# Patient Record
Sex: Male | Born: 1965 | Hispanic: Yes | Marital: Single | Smoking: Never smoker
Health system: Southern US, Community
[De-identification: ages and names within clinical notes are randomized; demographics above are authoritative.]

## PROBLEM LIST (undated history)

## (undated) DIAGNOSIS — R7303 Prediabetes: Secondary | ICD-10-CM

## (undated) DIAGNOSIS — G4730 Sleep apnea, unspecified: Secondary | ICD-10-CM

## (undated) DIAGNOSIS — I10 Essential (primary) hypertension: Secondary | ICD-10-CM

---

## 2019-12-26 ENCOUNTER — Encounter (HOSPITAL_COMMUNITY): Payer: Self-pay | Admitting: Emergency Medicine

## 2019-12-26 ENCOUNTER — Other Ambulatory Visit: Payer: Self-pay

## 2019-12-26 ENCOUNTER — Emergency Department (HOSPITAL_COMMUNITY)
Admission: EM | Admit: 2019-12-26 | Discharge: 2019-12-26 | Disposition: A | Discharge status: Home / Self Care | Payer: Medicaid Other | Attending: Emergency Medicine | Admitting: Emergency Medicine

## 2019-12-26 DIAGNOSIS — I10 Essential (primary) hypertension: Secondary | ICD-10-CM | POA: Diagnosis not present

## 2019-12-26 DIAGNOSIS — R040 Epistaxis: Secondary | ICD-10-CM | POA: Diagnosis present

## 2019-12-26 HISTORY — DX: Essential (primary) hypertension: I10

## 2019-12-26 HISTORY — DX: Sleep apnea, unspecified: G47.30

## 2019-12-26 NOTE — ED Notes (Signed)
ED Provider at bedside. 

## 2019-12-26 NOTE — ED Triage Notes (Signed)
Per [facility] pt from work where when he sneezed started having nose bleed.Was given Afrin spray with EMS. Pt is hypertensive and taking his medications.  Pt speak Spanish   using Spanish interpreter Doran Heater [phone].  Pt reports this happened last week and used cold water and it stopped.  Denies any pain.

## 2019-12-26 NOTE — Discharge Instructions (Addendum)
Use the Afrin if you have any future nosebleeds.  You cannot take this for more than 3 days in a row. You can take the Flonase daily to help with congestion. Return to the ED if you have additional nosebleeds that do not improve with pressure or Afrin, lightheadedness, shortness of breath or chest pain.   Utilice Afrin si tiene hemorragias nasales en el futuro. No puede tomar esto durante ms de 3 das seguidos. Puede tomar [employer] para ayudar con la congestin. Regrese al servicio de urgencias si tiene hemorragias nasales adicionales que no mejoran con la presin o con Afrin, mareos, falta de aire o Journalist, newspaper.

## 2019-12-26 NOTE — ED Provider Notes (Signed)
[facility]-EMERGENCY DEPT Provider Note   CSN: [id number] Arrival date & time: 12/26/19  1223     History Chief Complaint  Patient presents with  . Epistaxis    Kurt Wilkinson is a 54 y.o. male with a past medical history of HTN, who presents to ED with a chief complaint of epistaxis. States that last week he had a similar nosebleed last week which resolved with cold water. He was given Afrin by EMS and has been applying pressure and believes this has improved his nosebleed. He has been congested this week and sneezed today prior to the nosebleed. Denies anticoagulant use, lightheadedness, loss of consciousness, headache, vision changes.  HPI     Past Medical History:  Diagnosis Date  . Hypertension   . Sleep apnea     There are no problems to display for this patient.   History reviewed. No pertinent surgical history.     No family history on file.  Social History   Tobacco Use  . Smoking status: Never Smoker  . Smokeless tobacco: Never Used  Substance Use Topics  . Alcohol use: Yes    Comment: socially   . Drug use: Not on file    Home Medications Prior to Admission medications   Not on File    Allergies    Patient has no known allergies.  Review of Systems   Review of Systems  Constitutional: Negative for appetite change, chills and fever.  HENT: Positive for congestion and nosebleeds. Negative for ear pain, rhinorrhea, sneezing and sore throat.   Eyes: Negative for photophobia and visual disturbance.  Respiratory: Negative for cough, chest tightness, shortness of breath and wheezing.   Cardiovascular: Negative for chest pain and palpitations.  Gastrointestinal: Negative for abdominal pain, blood in stool, constipation, diarrhea, nausea and vomiting.  Genitourinary: Negative for dysuria, hematuria and urgency.  Musculoskeletal: Negative for myalgias.  Skin: Negative for rash.  Neurological: Negative for dizziness, weakness and  light-headedness.    Physical Exam Updated Vital Signs BP (!) 154/94 (BP Location: Right Arm)   Pulse (!) 103   Temp 98.2 F (36.8 C) (Oral)   Resp 18   Ht 5\' 9"  (1.753 m)   Wt 89.8 kg   SpO2 97%   BMI 29.24 kg/m   Physical Exam Vitals and nursing note reviewed.  Constitutional:      General: He is not in acute distress.    Appearance: He is well-developed.  HENT:     Head: Normocephalic and atraumatic.     Nose: Nose normal.     Comments: There is an area on the nasal septum of the R nare with dried blood. No active bleeding. Eyes:     General: No scleral icterus.       Right eye: No discharge.        Left eye: No discharge.     Conjunctiva/sclera: Conjunctivae normal.  Cardiovascular:     Rate and Rhythm: Normal rate and regular rhythm.     Heart sounds: Normal heart sounds. No murmur. No friction rub. No gallop.   Pulmonary:     Effort: Pulmonary effort is normal. No respiratory distress.     Breath sounds: Normal breath sounds.  Abdominal:     General: Bowel sounds are normal. There is no distension.     Palpations: Abdomen is soft.     Tenderness: There is no abdominal tenderness. There is no guarding.  Musculoskeletal:        General: Normal  range of motion.     Cervical back: Normal range of motion and neck supple.  Skin:    General: Skin is warm and dry.     Findings: No rash.  Neurological:     Mental Status: He is alert.     Motor: No abnormal muscle tone.     Coordination: Coordination normal.     ED Results / Procedures / Treatments   Labs (all labs ordered are listed, but only abnormal results are displayed) Labs Reviewed - No data to display  EKG None  Radiology No results found.  Procedures Procedures (including critical care time)  Medications Ordered in ED Medications - No data to display  ED Course  I have reviewed the triage vital signs and the nursing notes.  Pertinent labs & imaging results that were available during my  care of the patient were reviewed by me and considered in my medical decision making (see chart for details).    MDM Rules/Calculators/A&P                      54 year old male presents to ED for right-sided epistaxis that began prior to arrival after he sneezed.  Reports congestion for the past several days.  He had similar epistaxis last week which improved with cold water.  On exam there is some oozing noted in the right nare which I believe could be due to his ongoing to having with his gauze.  No bleeding noted in posterior oropharynx.  He denies any lightheadedness, shortness of breath.  Patient told to apply pressure here after given Afrin by EMS.  Patient was observed for about an hour without any recurrence of nosebleeds.  He is comfortable with discharge home.  Educated on use of Afrin as well as Flonase to help with congestion.  Patient is hemodynamically stable, in NAD, and able to ambulate in the ED. Evaluation does not show pathology that would require ongoing emergent intervention or inpatient treatment. I have personally reviewed and interpreted all lab work and imaging at today's ED visit. I explained the diagnosis to the patient. Pain has been managed and has no complaints prior to discharge. Patient is comfortable with above plan and is stable for discharge at this time. All questions were answered prior to disposition. Strict return precautions for returning to the ED were discussed. Encouraged follow up with PCP.   An After Visit Summary was printed and given to the patient.   Portions of this note were generated with Lobbyist. Dictation errors may occur despite best attempts at proofreading.  Final Clinical Impression(s) / ED Diagnoses Final diagnoses:  Right-sided epistaxis    Rx / DC Orders ED Discharge Orders    None       Delia Heady, PA-C 12/26/19 1334    Hayden Rasmussen, MD 12/26/19 1816

## 2020-01-07 ENCOUNTER — Emergency Department (HOSPITAL_COMMUNITY)
Admission: EM | Admit: 2020-01-07 | Discharge: 2020-01-07 | Disposition: A | Discharge status: Home / Self Care | Payer: Medicaid Other | Attending: Emergency Medicine | Admitting: Emergency Medicine

## 2020-01-07 ENCOUNTER — Other Ambulatory Visit: Payer: Self-pay

## 2020-01-07 DIAGNOSIS — I10 Essential (primary) hypertension: Secondary | ICD-10-CM | POA: Diagnosis not present

## 2020-01-07 DIAGNOSIS — R040 Epistaxis: Secondary | ICD-10-CM | POA: Diagnosis not present

## 2020-01-07 DIAGNOSIS — Z79899 Other long term (current) drug therapy: Secondary | ICD-10-CM | POA: Diagnosis not present

## 2020-01-07 NOTE — ED Provider Notes (Signed)
[facility] EMERGENCY DEPARTMENT Provider Note   CSN: [id number] Arrival date & time: 01/07/20  1529     History Chief Complaint  Patient presents with  . Epistaxis    Kurt Wilkinson is a 54 y.o. male.  HPI   Patient presents to the ED for evaluation of epistaxis.  Patient states he had an episode of a nosebleed on March 5.  Patient ended up coming to the ED after that episode.  Patient did not have any active bleeding.  He was instructed on outpatient management.  Patient states he had another episode today.  He had one episode in the morning.  He had another episode this afternoon so he came to the ED.  Patient's bleeding has now stopped.  Patient states it started after he sneezed.  He did try to apply pressure but as he demonstrates this to me he shows himself pinching the bridge of his nose.  Past Medical History:  Diagnosis Date  . Hypertension   . Sleep apnea     There are no problems to display for this patient.   No past surgical history on file.     No family history on file.  Social History   Tobacco Use  . Smoking status: Never Smoker  . Smokeless tobacco: Never Used  Substance Use Topics  . Alcohol use: Yes    Comment: socially   . Drug use: Not on file    Home Medications Prior to Admission medications   Medication Sig Start Date End Date Taking? Authorizing Provider  amLODipine (NORVASC) 10 MG tablet Take 10 mg by mouth daily.   Yes [provider]  lisinopril (ZESTRIL) 40 MG tablet Take 40 mg by mouth in the morning and at bedtime.   Yes [provider]  oxymetazoline (AFRIN) 0.05 % nasal spray Place 1 spray into both nostrils 2 (two) times daily.   Yes [provider]    Allergies    Patient has no known allergies.  Review of Systems   Review of Systems  All other systems reviewed and are negative.   Physical Exam Updated Vital Signs BP (!) 152/81   Pulse (!) 106   Temp 98.4 F (36.9 C)  (Oral)   Resp 16   Ht 1.753 m (5\' 9" )   Wt 89.8 kg   SpO2 98%   BMI 29.24 kg/m   Physical Exam Vitals and nursing note reviewed.  Constitutional:      General: He is not in acute distress.    Appearance: He is well-developed.  HENT:     Head: Normocephalic and atraumatic.     Right Ear: External ear normal.     Left Ear: External ear normal.     Nose:     Comments: Dried blood right anterior nares, no active bleeding, some hyperemia noted around the scab in the nasal septum on the right side Eyes:     General: No scleral icterus.       Right eye: No discharge.        Left eye: No discharge.     Conjunctiva/sclera: Conjunctivae normal.  Neck:     Trachea: No tracheal deviation.  Cardiovascular:     Rate and Rhythm: Normal rate.  Pulmonary:     Effort: Pulmonary effort is normal. No respiratory distress.     Breath sounds: No stridor.  Abdominal:     General: There is no distension.  Musculoskeletal:        General:  No swelling or deformity.     Cervical back: Neck supple.  Skin:    General: Skin is warm and dry.     Findings: No rash.  Neurological:     Mental Status: He is alert.     Cranial Nerves: Cranial nerve deficit: no gross deficits.     ED Results / Procedures / Treatments   Labs (all labs ordered are listed, but only abnormal results are displayed) Labs Reviewed - No data to display  EKG None  Radiology No results found.  Procedures Procedures (including critical care time)  Medications Ordered in ED Medications - No data to display  ED Course  I have reviewed the triage vital signs and the nursing notes.  Pertinent labs & imaging results that were available during my care of the patient were reviewed by me and considered in my medical decision making (see chart for details).    MDM Rules/Calculators/A&P                      Monitored in the ED.  No recurrent bleeding.  Discussed how to apply pressure.  Recommend saline spray, can use  afrin if bleeding recurs.  Follow up with ENT Final Clinical Impression(s) / ED Diagnoses Final diagnoses:  Epistaxis    Rx / DC Orders ED Discharge Orders    None       Dorie Rank, MD 01/07/20 1807

## 2020-01-07 NOTE — Discharge Instructions (Signed)
Use over the counter saline nasal spray to keep your nose moist, avoid scratching your nose.  You can use the afrin spray and pinch the bottom of your nose for 15 minutes to help control the bleeding if it restarts

## 2020-01-07 NOTE — ED Triage Notes (Signed)
Pt here from home for evaluation of two nosebleeds today, the first resolved, but the second was still ongoing at time of EMS call. Administered afrin PTA. No blood thinners. No bleeding at the time of triage.

## 2020-01-07 NOTE — ED Notes (Signed)
Translator used to go over discharge papers.  

## 2020-02-17 ENCOUNTER — Encounter (HOSPITAL_BASED_OUTPATIENT_CLINIC_OR_DEPARTMENT_OTHER): Payer: Self-pay | Admitting: *Deleted

## 2020-02-17 ENCOUNTER — Other Ambulatory Visit: Payer: Self-pay

## 2020-02-17 ENCOUNTER — Emergency Department (HOSPITAL_BASED_OUTPATIENT_CLINIC_OR_DEPARTMENT_OTHER)
Admission: EM | Admit: 2020-02-17 | Discharge: 2020-02-18 | Disposition: A | Discharge status: Home / Self Care | Payer: Medicaid Other | Attending: Emergency Medicine | Admitting: Emergency Medicine

## 2020-02-17 DIAGNOSIS — R0989 Other specified symptoms and signs involving the circulatory and respiratory systems: Secondary | ICD-10-CM | POA: Diagnosis not present

## 2020-02-17 DIAGNOSIS — T61781A Other shellfish poisoning, accidental (unintentional), initial encounter: Secondary | ICD-10-CM

## 2020-02-17 DIAGNOSIS — I10 Essential (primary) hypertension: Secondary | ICD-10-CM | POA: Diagnosis not present

## 2020-02-17 DIAGNOSIS — Z79899 Other long term (current) drug therapy: Secondary | ICD-10-CM | POA: Insufficient documentation

## 2020-02-17 DIAGNOSIS — T781XXA Other adverse food reactions, not elsewhere classified, initial encounter: Secondary | ICD-10-CM

## 2020-02-17 MED ORDER — FAMOTIDINE IN NACL 20-0.9 MG/50ML-% IV SOLN
INTRAVENOUS | Status: AC
  Administered 2020-02-17: 40 mg via INTRAVENOUS
  Filled 2020-02-17: qty 50

## 2020-02-17 MED ORDER — DIPHENHYDRAMINE HCL 50 MG/ML IJ SOLN
INTRAMUSCULAR | Status: AC
  Administered 2020-02-17: 50 mg
  Filled 2020-02-17: qty 1

## 2020-02-17 MED ORDER — METHYLPREDNISOLONE SODIUM SUCC 125 MG IJ SOLR
INTRAMUSCULAR | Status: AC
  Administered 2020-02-17: 125 mg
  Filled 2020-02-17: qty 2

## 2020-02-17 NOTE — ED Provider Notes (Signed)
[facility] Provider Note: [name], MD, FACEP  CSN: [id number] MRN: [id number] ARRIVAL: 02/17/20 at 2320 ROOM: [facility]   CHIEF COMPLAINT  Allergic Reaction   HISTORY OF PRESENT ILLNESS  02/17/20 11:38 PM Kurt Wilkinson is a 54 y.o. male with a history of shellfish allergy.  He ate soup about 30 minutes prior to arrival which may have contained crab.  He subsequently developed throat swelling with difficulty swallowing as well as generalized itching and 2 episodes of diarrhea.  He denies nausea or vomiting.  He is not wheezing.  Symptoms are moderate.  He was being given IV Benadryl and Pepcid as I entered the room.    Past Medical History:  Diagnosis Date  . Hypertension   . Sleep apnea     History reviewed. No pertinent surgical history.  No family history on file.  Social History   Tobacco Use  . Smoking status: Never Smoker  . Smokeless tobacco: Never Used  Substance Use Topics  . Alcohol use: Yes    Comment: socially   . Drug use: Not on file    Prior to Admission medications   Medication Sig Start Date End Date Taking? Authorizing Provider  amLODipine (NORVASC) 10 MG tablet Take 10 mg by mouth daily.    [provider]  lisinopril (ZESTRIL) 40 MG tablet Take 40 mg by mouth in the morning and at bedtime.    [provider]  oxymetazoline (AFRIN) 0.05 % nasal spray Place 1 spray into both nostrils 2 (two) times daily.    [provider]    Allergies Patient has no known allergies.   REVIEW OF SYSTEMS  Negative except as noted here or in the History of Present Illness.   PHYSICAL EXAMINATION  Initial Vital Signs Blood pressure (!) 155/94, pulse 92, temperature 98.7 F (37.1 C), temperature source Oral, resp. rate 18, height 5\' 9"  (1.753 m), weight 85.7 kg, SpO2 98 %.  Examination General: Well-developed, well-nourished male in no acute distress; appearance consistent with age of record HENT: normocephalic;  atraumatic; mild pharyngeal edema; no stridor Eyes: pupils equal, round and reactive to light; extraocular muscles intact Neck: supple Heart: regular rate and rhythm Lungs: clear to auscultation bilaterally Abdomen: soft; nondistended; nontender; bowel sounds present Extremities: No deformity; full range of motion; pulses normal Neurologic: Awake, alert; motor function intact in all extremities and symmetric; no facial droop Skin: Warm and dry; no visible rash Psychiatric: Normal mood and affect   RESULTS  Summary of this visit's results, reviewed and interpreted by myself:   EKG Interpretation  Date/Time:    Ventricular Rate:    PR Interval:    QRS Duration:   QT Interval:    QTC Calculation:   R Axis:     Text Interpretation:        Laboratory Studies: No results found for this or any previous visit (from the past 24 hour(s)). Imaging Studies: No results found.  ED COURSE and MDM  Nursing notes, initial and subsequent vitals signs, including pulse oximetry, reviewed and interpreted by myself.  Vitals:   02/17/20 2331 02/18/20 0123  BP: (!) 155/94 (!) 148/84  Pulse: 92 90  Resp: 18 18  Temp: 98.7 F (37.1 C) 98.7 F (37.1 C)  TempSrc: Oral Oral  SpO2: 98% 99%  Weight: 85.7 kg   Height: 5\' 9"  (1.753 m)    Medications  methylPREDNISolone sodium succinate (SOLU-MEDROL) 125 mg/2 mL injection (125 mg  Given 02/17/20 2343)  diphenhydrAMINE (BENADRYL)  50 MG/ML injection (50 mg  Given 02/17/20 2342)  famotidine (PEPCID) 20-0.9 MG/50ML-% IVPB (  Stopped 02/18/20 0048)   1:26 AM Patient symptoms resolved.  We will keep him on Benadryl for the next 24 hours and then as needed.  We will also provide an EpiPen.   PROCEDURES  Procedures   ED DIAGNOSES     ICD-10-CM   1. Allergic reaction to shellfish  T78.1XXA        Cary Lothrop, Jenny Reichmann, MD 02/18/20 [phone]

## 2020-02-17 NOTE — ED Triage Notes (Addendum)
Pt c/o allergic reaction x 30 mins ago after eating soup/ pt states hard to swallow , tightness  in throat , rash  . 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

## 2020-02-18 ENCOUNTER — Emergency Department: Payer: Medicaid Other

## 2020-02-18 ENCOUNTER — Encounter (HOSPITAL_BASED_OUTPATIENT_CLINIC_OR_DEPARTMENT_OTHER): Payer: Self-pay | Admitting: *Deleted

## 2020-02-18 ENCOUNTER — Emergency Department (HOSPITAL_BASED_OUTPATIENT_CLINIC_OR_DEPARTMENT_OTHER): Payer: Medicaid Other

## 2020-02-18 ENCOUNTER — Emergency Department (HOSPITAL_BASED_OUTPATIENT_CLINIC_OR_DEPARTMENT_OTHER)
Admission: EM | Admit: 2020-02-18 | Discharge: 2020-02-18 | Disposition: A | Discharge status: Home / Self Care | Payer: Medicaid Other | Source: Home / Self Care | Attending: Emergency Medicine | Admitting: Emergency Medicine

## 2020-02-18 ENCOUNTER — Other Ambulatory Visit: Payer: Self-pay

## 2020-02-18 DIAGNOSIS — E041 Nontoxic single thyroid nodule: Secondary | ICD-10-CM

## 2020-02-18 DIAGNOSIS — E876 Hypokalemia: Secondary | ICD-10-CM

## 2020-02-18 LAB — BASIC METABOLIC PANEL
Anion gap: 14 (ref 5–15)
BUN: 19 mg/dL (ref 6–20)
CO2: 23 mmol/L (ref 22–32)
Calcium: 9 mg/dL (ref 8.9–10.3)
Chloride: 103 mmol/L (ref 98–111)
Creatinine, Ser: 1.07 mg/dL (ref 0.61–1.24)
GFR calc Af Amer: >60 mL/min (ref >60)
GFR calc non Af Amer: >60 mL/min (ref >60)
Glucose, Bld: 245 mg/dL — ABNORMAL HIGH (ref 70–99)
Potassium: 2.7 mmol/L — CL (ref 3.5–5.1)
Sodium: 140 mmol/L (ref 135–145)

## 2020-02-18 LAB — MAGNESIUM: Magnesium: 2.3 mg/dL (ref 1.7–2.4)

## 2020-02-18 LAB — TSH: TSH: 0.499 u[IU]/mL (ref 0.350–4.500)

## 2020-02-18 LAB — CBC
HCT: 43.9 % (ref 39.0–52.0)
Hemoglobin: 15.3 g/dL (ref 13.0–17.0)
MCH: 32.8 pg (ref 26.0–34.0)
MCHC: 34.9 g/dL (ref 30.0–36.0)
MCV: 94.2 fL (ref 80.0–100.0)
Platelets: 181 10*3/uL (ref 150–400)
RBC: 4.66 MIL/uL (ref 4.22–5.81)
RDW: 14.7 % (ref 11.5–15.5)
WBC: 6.7 10*3/uL (ref 4.0–10.5)
nRBC: 0 % (ref 0.0–0.2)

## 2020-02-18 LAB — T4, FREE: Free T4: 0.67 ng/dL (ref 0.61–1.12)

## 2020-02-18 LAB — TROPONIN I (HIGH SENSITIVITY)
Troponin I (High Sensitivity): 10 ng/L (ref <18)
Troponin I (High Sensitivity): 22 ng/L — ABNORMAL HIGH (ref <18)
Troponin I (High Sensitivity): 24 ng/L — ABNORMAL HIGH (ref <18)

## 2020-02-18 LAB — CBG MONITORING, ED: Glucose-Capillary: 188 mg/dL — ABNORMAL HIGH (ref 70–99)

## 2020-02-18 MED ORDER — POTASSIUM CHLORIDE CRYS ER 20 MEQ PO TBCR
40.0000 meq | EXTENDED_RELEASE_TABLET | Freq: Once | ORAL | Status: AC
  Administered 2020-02-18: 10:00:00 40 meq via ORAL
  Filled 2020-02-18: qty 2

## 2020-02-18 MED ORDER — EPINEPHRINE 0.3 MG/0.3ML IJ SOAJ: INTRAMUSCULAR | 0 refills | Status: AC

## 2020-02-18 MED ORDER — POTASSIUM CHLORIDE ER 10 MEQ PO TBCR
10.0000 meq | EXTENDED_RELEASE_TABLET | Freq: Two times a day (BID) | ORAL | 0 refills | Status: DC
Start: 2020-02-18 — End: 2020-07-25

## 2020-02-18 MED ORDER — SODIUM CHLORIDE 0.9 % IV SOLN: INTRAVENOUS | Status: DC

## 2020-02-18 MED ORDER — DIPHENHYDRAMINE HCL 25 MG PO TABS: 50.0000 mg | ORAL_TABLET | Freq: Four times a day (QID) | ORAL | 0 refills | Status: AC | PRN

## 2020-02-18 MED ORDER — ASPIRIN 81 MG PO CHEW
324.0000 mg | CHEWABLE_TABLET | Freq: Once | ORAL | Status: AC
  Administered 2020-02-18: 324 mg via ORAL
  Filled 2020-02-18: qty 4

## 2020-02-18 MED ORDER — POTASSIUM CHLORIDE 10 MEQ/100ML IV SOLN
10.0000 meq | INTRAVENOUS | Status: AC
  Administered 2020-02-18 (×2): 10 meq via INTRAVENOUS
  Filled 2020-02-18 (×2): qty 100

## 2020-02-18 MED ORDER — IOHEXOL 350 MG/ML SOLN
100.0000 mL | Freq: Once | INTRAVENOUS | Status: AC | PRN
  Administered 2020-02-18: 100 mL via INTRAVENOUS

## 2020-02-18 MED ORDER — AMLODIPINE BESYLATE 10 MG PO TABS: 10.0000 mg | ORAL_TABLET | Freq: Every day | ORAL | 1 refills | Status: AC

## 2020-02-18 MED ORDER — SODIUM CHLORIDE 0.9 % IV BOLUS
1000.0000 mL | Freq: Once | INTRAVENOUS | Status: AC
  Administered 2020-02-18: 1000 mL via INTRAVENOUS

## 2020-02-18 NOTE — Discharge Instructions (Signed)
It is important for you to follow up with a primary care doctor or endocrinologist doctor as we discussed to evaluate the thyroid nodule noted on CT scan today.  Often times these are benign but we need to make sure this is not a type of cancer.  You should also have your potassium level rechecked in the next week.  Es importante que realice un seguimiento con un mdico de atencin primaria o un mdico endocrinlogo, como lo discutimos, para Heritage manager ndulo tiroideo que se observa en la tomografa computarizada hoy. A menudo, estos son benignos, pero debemos asegurarnos de que no sea un tipo de Database administrator.  Tambin debe volver a controlar su nivel de potasio en la prxima semana.

## 2020-02-18 NOTE — ED Provider Notes (Addendum)
[facility] EMERGENCY DEPARTMENT Provider Note   CSN: [id number] Arrival date & time: 02/18/20  0849     History Chief Complaint  Patient presents with  . Heart Pounding    Kurt Wilkinson is a 54 y.o. male.  HPI  HPI: A 54 year old patient with a history of hypertension presents for evaluation of chest pain. Initial onset of pain was approximately 1-3 hours ago. The patient's chest pain is not worse with exertion. The patient's chest pain is not middle- or left-sided, is not well-localized, is not described as heaviness/pressure/tightness, is not sharp and does radiate to the arms/jaw/neck. The patient does not complain of nausea and denies diaphoresis. The patient has no history of stroke, has no history of peripheral artery disease, has not smoked in the past 90 days, denies any history of treated diabetes, has no relevant family history of coronary artery disease (first degree relative at less than age 68), has no history of hypercholesterolemia and does not have an elevated BMI (>=30). Pt states he has history of htn.  He took his meds this am.  He then started having cramping in his arms and heart beating fast as well as cramping  In his stomach.  Pt denies any chest pain.  Just cramping in the arms.  No trouble with shortness of breath.  The sx have resolved at this point.  They last about 1/2 an hour.  Past Medical History:  Diagnosis Date  . Hypertension   . Sleep apnea     There are no problems to display for this patient.   History reviewed. No pertinent surgical history.     History reviewed. No pertinent family history.  Social History   Tobacco Use  . Smoking status: Never Smoker  . Smokeless tobacco: Never Used  Substance Use Topics  . Alcohol use: Yes    Comment: socially   . Drug use: Never    Home Medications Prior to Admission medications   Medication Sig Start Date End Date Taking? Authorizing Provider  amLODipine (NORVASC) 10 MG tablet  Take 10 mg by mouth daily.   Yes [provider]  lisinopril (ZESTRIL) 40 MG tablet Take 40 mg by mouth in the morning and at bedtime.   Yes [provider]  diphenhydrAMINE (BENADRYL) 25 MG tablet Take 2 tablets (50 mg total) by mouth every 6 (six) hours as needed for itching or allergies. 02/18/20   Molpus, John, MD  EPINEPHrine (EPIPEN 2-PAK) 0.3 mg/0.3 mL IJ SOAJ injection Self administer per package instructions as needed for severe allergic reaction and seek medical care. 02/18/20   Molpus, John, MD  oxymetazoline (AFRIN) 0.05 % nasal spray Place 1 spray into both nostrils 2 (two) times daily.    [provider]    Allergies    Parke Simmers allergy]  Review of Systems   Review of Systems  Physical Exam Updated Vital Signs BP (!) 145/94   Pulse 100   Resp (!) 25   Ht 1.753 m (5\' 9" )   Wt 85.7 kg   SpO2 96%   BMI 27.91 kg/m   Physical Exam  ED Results / Procedures / Treatments   Labs (all labs ordered are listed, but only abnormal results are displayed) Labs Reviewed  BASIC METABOLIC PANEL - Abnormal; Notable for the following components:      Result Value   Potassium 2.7 (*)    Glucose, Bld 245 (*)    All other components within normal limits  CBG MONITORING,  ED - Abnormal; Notable for the following components:   Glucose-Capillary 188 (*)    All other components within normal limits  TROPONIN I (HIGH SENSITIVITY) - Abnormal; Notable for the following components:   Troponin I (High Sensitivity) 22 (*)    All other components within normal limits  CBC  T4, FREE  TSH  MAGNESIUM  TROPONIN I (HIGH SENSITIVITY)    EKG EKG Interpretation  Date/Time:  Wednesday February 18 2020 08:55:14 EDT Ventricular Rate:  113 PR Interval:    QRS Duration: 88 QT Interval:  326 QTC Calculation: 447 R Axis:   56 Text Interpretation: Sinus tachycardia Probable left atrial enlargement Repol abnrm, diffusely no old tracing for comparison Confirmed by  Linwood Dibbles [phone]) on 02/18/2020 9:03:14 AM   Radiology DG Chest Portable 1 View  Result Date: 02/18/2020 CLINICAL DATA:  Chest pain. EXAM: PORTABLE CHEST 1 VIEW COMPARISON:  No pertinent prior studies available for comparison. FINDINGS: Heart size within normal limits. No appreciable airspace consolidation. No evidence of pleural effusion or pneumothorax. No acute bony abnormality. IMPRESSION: No evidence of acute cardiopulmonary abnormality. Electronically Signed   By: Jackey Loge DO   On: 02/18/2020 10:03    Procedures Procedures (including critical care time)  Medications Ordered in ED Medications  sodium chloride 0.9 % bolus 1,000 mL (0 mLs Intravenous Stopped 02/18/20 1043)    And  0.9 %  sodium chloride infusion ( Intravenous New Bag/Given 02/18/20 1042)  aspirin chewable tablet 324 mg (324 mg Oral Given 02/18/20 1015)  potassium chloride SA (KLOR-CON) CR tablet 40 mEq (40 mEq Oral Given 02/18/20 1014)  potassium chloride 10 mEq in 100 mL IVPB (0 mEq Intravenous Stopped 02/18/20 1222)    ED Course  I have reviewed the triage vital signs and the nursing notes.  Pertinent labs & imaging results that were available during my care of the patient were reviewed by me and considered in my medical decision making (see chart for details).  Clinical Course as of Feb 19 828  Wed Feb 18, 2020  1007 Notify the patient's potassium is  2.7   [JK]  1253 Magnesium levels normal.  Troponin is normal.   [JK]  1253 Chest x-ray without acute findings  DG Chest Portable 1 View [JK]  1557 CT notable for thyroid nodule.  TSH is normal.  No hyperthyroidism.    [JK]  1557 2nd trop with increased delta.  Sx not suggestive of acs.  Will check 3rd trop.  If stable I think pt can follow up as outpatient   [JK]  1558 Care turned over to Dr Deretha Emory   [JK]    Clinical Course User Index [JK] Linwood Dibbles, MD   MDM Rules/Calculators/A&P HEAR Score: 3                    Patient presents to the ED for  evaluation of palpitations.  Patient denies having any specific chest pain.  Was tachycardic on arrival.  Patient's labs were notable for hypokalemia.  He has been given IV potassium.  Patient has had some persistent tachycardia and slight elevation in troponin.  He does have some nonspecific EKG changes.   This may be related to his hypokalemia but he did have a rise in his troponin.  With his tachycardia and slight elevation of troponin I will proceed with CT angiogram to rule out pulmonary embolism. Final Clinical Impression(s) / ED Diagnoses Final diagnoses:  None    Rx / DC Orders ED  Discharge Orders    None       Dorie Rank, MD 02/18/20 1436    Dorie Rank, MD 02/19/20 0830

## 2020-02-18 NOTE — ED Triage Notes (Signed)
Took Lisinopril this morning and felt like his heart is pounding and arm tingling.

## 2020-02-18 NOTE — ED Provider Notes (Signed)
Patient's third troponin is back.  Initial troponin was 10 repeat was 22 and the third 1 was 24 so no significant change between 22 and 24.  If there was no significant change patient was stable for discharge home and follow-up for the thyroid nodule.  Patient also had some mild low potassium.  Treated for it here and has outpatient medicine for that.   Vanetta Mulders, MD 02/18/20 [phone]

## 2020-07-25 ENCOUNTER — Encounter (HOSPITAL_BASED_OUTPATIENT_CLINIC_OR_DEPARTMENT_OTHER): Payer: Self-pay | Admitting: Emergency Medicine

## 2020-07-25 ENCOUNTER — Emergency Department (HOSPITAL_BASED_OUTPATIENT_CLINIC_OR_DEPARTMENT_OTHER): Payer: Medicaid Other

## 2020-07-25 ENCOUNTER — Emergency Department (HOSPITAL_BASED_OUTPATIENT_CLINIC_OR_DEPARTMENT_OTHER)
Admission: EM | Admit: 2020-07-25 | Discharge: 2020-07-25 | Disposition: A | Discharge status: Home / Self Care | Payer: Medicaid Other | Attending: Emergency Medicine | Admitting: Emergency Medicine

## 2020-07-25 ENCOUNTER — Other Ambulatory Visit: Payer: Self-pay

## 2020-07-25 DIAGNOSIS — E876 Hypokalemia: Secondary | ICD-10-CM | POA: Diagnosis not present

## 2020-07-25 DIAGNOSIS — R079 Chest pain, unspecified: Secondary | ICD-10-CM | POA: Diagnosis present

## 2020-07-25 DIAGNOSIS — Z79899 Other long term (current) drug therapy: Secondary | ICD-10-CM | POA: Insufficient documentation

## 2020-07-25 DIAGNOSIS — R0789 Other chest pain: Secondary | ICD-10-CM | POA: Diagnosis not present

## 2020-07-25 DIAGNOSIS — I10 Essential (primary) hypertension: Secondary | ICD-10-CM | POA: Diagnosis not present

## 2020-07-25 LAB — CBC
HCT: 41.8 % (ref 39.0–52.0)
Hemoglobin: 14.7 g/dL (ref 13.0–17.0)
MCH: 33.9 pg (ref 26.0–34.0)
MCHC: 35.2 g/dL (ref 30.0–36.0)
MCV: 96.3 fL (ref 80.0–100.0)
Platelets: 154 10*3/uL (ref 150–400)
RBC: 4.34 MIL/uL (ref 4.22–5.81)
RDW: 14.6 % (ref 11.5–15.5)
WBC: 4 10*3/uL (ref 4.0–10.5)
nRBC: 0 % (ref 0.0–0.2)

## 2020-07-25 LAB — BASIC METABOLIC PANEL
Anion gap: 10 (ref 5–15)
BUN: 17 mg/dL (ref 6–20)
CO2: 28 mmol/L (ref 22–32)
Calcium: 8.8 mg/dL — ABNORMAL LOW (ref 8.9–10.3)
Chloride: 101 mmol/L (ref 98–111)
Creatinine, Ser: 0.86 mg/dL (ref 0.61–1.24)
GFR calc Af Amer: >60 mL/min (ref >60)
GFR calc non Af Amer: >60 mL/min (ref >60)
Glucose, Bld: 127 mg/dL — ABNORMAL HIGH (ref 70–99)
Potassium: 2.6 mmol/L — CL (ref 3.5–5.1)
Sodium: 139 mmol/L (ref 135–145)

## 2020-07-25 LAB — TROPONIN I (HIGH SENSITIVITY)
Troponin I (High Sensitivity): 19 ng/L — ABNORMAL HIGH (ref <18)
Troponin I (High Sensitivity): 21 ng/L — ABNORMAL HIGH (ref <18)

## 2020-07-25 LAB — MAGNESIUM: Magnesium: 1.9 mg/dL (ref 1.7–2.4)

## 2020-07-25 MED ORDER — SODIUM CHLORIDE 0.9 % IV SOLN: Freq: Once | INTRAVENOUS | Status: AC

## 2020-07-25 MED ORDER — POTASSIUM CHLORIDE 10 MEQ/100ML IV SOLN
10.0000 meq | INTRAVENOUS | Status: AC
  Administered 2020-07-25 (×2): 10 meq via INTRAVENOUS
  Filled 2020-07-25 (×2): qty 100

## 2020-07-25 MED ORDER — POTASSIUM CHLORIDE CRYS ER 20 MEQ PO TBCR
40.0000 meq | EXTENDED_RELEASE_TABLET | Freq: Once | ORAL | Status: AC
  Administered 2020-07-25: 40 meq via ORAL
  Filled 2020-07-25: qty 2

## 2020-07-25 MED ORDER — POTASSIUM CHLORIDE ER 10 MEQ PO TBCR
10.0000 meq | EXTENDED_RELEASE_TABLET | Freq: Two times a day (BID) | ORAL | 0 refills | Status: DC
Start: 2020-07-25 — End: 2020-12-15

## 2020-07-25 MED ORDER — LIDOCAINE 5 % EX PTCH
1.0000 | MEDICATED_PATCH | Freq: Once | CUTANEOUS | Status: DC
  Administered 2020-07-25: 1 via TRANSDERMAL
  Filled 2020-07-25: qty 1

## 2020-07-25 MED ORDER — ACETAMINOPHEN 325 MG PO TABS
650.0000 mg | ORAL_TABLET | Freq: Once | ORAL | Status: AC
  Administered 2020-07-25: 650 mg via ORAL
  Filled 2020-07-25: qty 2

## 2020-07-25 NOTE — ED Notes (Signed)
Date and time results received: 07/25/20 1416 (use smartphrase ".now" to insert current time)  Test: potassium Critical Value: 2.6  Name of Provider Notified: Steinl Orders Received? Or Actions Taken?: no orders given

## 2020-07-25 NOTE — Discharge Instructions (Addendum)
The pain in your back is probably because some a pulled muscle.  Your lab work today showed that your potassium level was low.  You need to take your potassium pills at home.  Even on days you eat a banana you should still take potassium pills.  Recommend you take Tylenol as needed for pain in your back.  You can also buy over-the-counter lidocaine patches if that helps.  Follow-up with your primary care doctor within 1 week to have your potassium rechecked.

## 2020-07-25 NOTE — ED Triage Notes (Signed)
Using interpreter # [id number].  Pt c/o pain to left chest that radiates to back onset yesterday after bending down to pick up a cell phone. Pt sneezed today and pain returned.

## 2020-07-25 NOTE — ED Notes (Signed)
Patient denies chest pain and is resting comfortably.

## 2020-07-25 NOTE — ED Provider Notes (Signed)
Does have [facility] EMERGENCY DEPARTMENT Provider Note   CSN: [id number] Arrival date & time: 07/25/20  1233     History Chief Complaint  Patient presents with  . Chest Pain    Kurt Wilkinson is a 54 y.o. male with past medical history significant for hypertension and sleep apnea.  HPI Patient presents to emergency department today with chief complaint of chest pain and back pain x2 days.  Patient states the pain started yesterday after he bent down to pick up his cell phone.  He states the pain is located on the right side of his mid back, Pain felt like an aching sensation.  He rated the pain 8 of 10 in severity.  He took an ibuprofen and pain significantly improved.  Patient states today he sneezed and the pain returned. Pain is in the same spot on his back and radiates to the front of his chest.  Pain is intermittent. He denies pain being a tearing or ripping sensation. No medications for his symptoms prior to arrival.  He states the pain is worse when changing positions.  Pain is not worse with exertion.  He has no pain currently. He denies history of similar pain. Denies dyspnea on exertion, SOB, chest tightness or pressure, radiation to left/right arm, jaw or back, nausea, or diaphoresis. Patient also states he is a history of low potassium.  He is supposed to take potassium supplements however has not been taking them x 7 days.  He admits to eating bananas daily instead.  He states when he eats a banana he does not take his potassium pills.  Due to language barrier, a video interpreter was present during the history-taking and subsequent discussion (and for part of the physical exam) with this patient.      Past Medical History:  Diagnosis Date  . Hypertension   . Sleep apnea     There are no problems to display for this patient.   History reviewed. No pertinent surgical history.     No family history on file.  Social History   Tobacco Use  . Smoking  status: Never Smoker  . Smokeless tobacco: Never Used  Vaping Use  . Vaping Use: Never used  Substance Use Topics  . Alcohol use: Yes    Comment: socially   . Drug use: Never    Home Medications Prior to Admission medications   Medication Sig Start Date End Date Taking? Authorizing Provider  amLODipine (NORVASC) 10 MG tablet Take 1 tablet (10 mg total) by mouth daily. 02/18/20   Linwood Dibbles, MD  diphenhydrAMINE (BENADRYL) 25 MG tablet Take 2 tablets (50 mg total) by mouth every 6 (six) hours as needed for itching or allergies. 02/18/20   Molpus, John, MD  EPINEPHrine (EPIPEN 2-PAK) 0.3 mg/0.3 mL IJ SOAJ injection Self administer per package instructions as needed for severe allergic reaction and seek medical care. 02/18/20   Molpus, John, MD  lisinopril (ZESTRIL) 40 MG tablet Take 40 mg by mouth in the morning and at bedtime.    [provider]  oxymetazoline (AFRIN) 0.05 % nasal spray Place 1 spray into both nostrils 2 (two) times daily.    [provider]  potassium chloride (KLOR-CON) 10 MEQ tablet Take 1 tablet (10 mEq total) by mouth 2 (two) times daily for 7 days. 07/25/20 08/01/20  Markell Sciascia, Caroleen Hamman, PA-C    Allergies    Parke Simmers allergy]  Review of Systems   Review of Systems  All other  systems are reviewed and are negative for acute change except as noted in the HPI.   Physical Exam Updated Vital Signs BP 138/81 (BP Location: Left Arm)   Pulse 88   Temp 98.1 F (36.7 C) (Oral)   Resp 20   Ht 5\' 9"  (1.753 m)   Wt 95.3 kg   SpO2 99%   BMI 31.01 kg/m   Physical Exam Vitals and nursing note reviewed.  Constitutional:      General: He is not in acute distress.    Appearance: He is not ill-appearing.  HENT:     Head: Normocephalic and atraumatic.     Right Ear: Tympanic membrane and external ear normal.     Left Ear: Tympanic membrane and external ear normal.     Nose: Nose normal.     Mouth/Throat:     Mouth: Mucous membranes are moist.       Pharynx: Oropharynx is clear.  Eyes:     General: No scleral icterus.       Right eye: No discharge.        Left eye: No discharge.     Extraocular Movements: Extraocular movements intact.     Conjunctiva/sclera: Conjunctivae normal.     Pupils: Pupils are equal, round, and reactive to light.  Neck:     Vascular: No JVD.  Cardiovascular:     Rate and Rhythm: Normal rate and regular rhythm.     Pulses: Normal pulses.          Radial pulses are 2+ on the right side and 2+ on the left side.       Dorsalis pedis pulses are 2+ on the right side and 2+ on the left side.     Heart sounds: Normal heart sounds.  Pulmonary:     Comments: Lungs clear to auscultation in all fields. Symmetric chest rise. No wheezing, rales, or rhonchi. Chest:     Chest wall: No tenderness.  Abdominal:     Comments: Abdomen is soft, non-distended, and non-tender in all quadrants. No rigidity, no guarding. No peritoneal signs.  Musculoskeletal:        General: Normal range of motion.       Arms:     Cervical back: Normal range of motion.     Right lower leg: No edema.     Left lower leg: No edema.     Comments: Tenderness with deep palpation. No overlying skin changes  Skin:    General: Skin is warm and dry.     Capillary Refill: Capillary refill takes less than 2 seconds.  Neurological:     Mental Status: He is oriented to person, place, and time.     GCS: GCS eye subscore is 4. GCS verbal subscore is 5. GCS motor subscore is 6.     Comments: Fluent speech, no facial droop.  Psychiatric:        Behavior: Behavior normal.     ED Results / Procedures / Treatments   Labs (all labs ordered are listed, but only abnormal results are displayed) Labs Reviewed  BASIC METABOLIC PANEL - Abnormal; Notable for the following components:      Result Value   Potassium 2.6 (*)    Glucose, Bld 127 (*)    Calcium 8.8 (*)    All other components within normal limits  TROPONIN I (HIGH SENSITIVITY) - Abnormal;  Notable for the following components:   Troponin I (High Sensitivity) 19 (*)    All other components  within normal limits  TROPONIN I (HIGH SENSITIVITY) - Abnormal; Notable for the following components:   Troponin I (High Sensitivity) 21 (*)    All other components within normal limits  CBC  MAGNESIUM    EKG EKG Interpretation  Date/Time:  Sunday July 25 2020 12:56:32 EDT Ventricular Rate:  93 PR Interval:  132 QRS Duration: 84 QT Interval:  340 QTC Calculation: 422 R Axis:   52 Text Interpretation: Normal sinus rhythm Non-specific ST-t changes U waves present Confirmed by Cathren LaineSteinl, Kevin ([id number]) on 07/25/2020 2:15:54 PM   Radiology DG Chest 2 View  Result Date: 07/25/2020 CLINICAL DATA:  Left chest and back pain since yesterday when the patient bent over to pick up a phone. EXAM: CHEST - 2 VIEW COMPARISON:  Single-view of the chest and CT chest 02/18/2020. FINDINGS: Lungs clear. Heart size normal. No pneumothorax or pleural fluid. No acute or focal bony abnormality. IMPRESSION: Negative chest. Electronically Signed   By: Drusilla Kannerhomas  Dalessio M.D.   On: 07/25/2020 13:48    Procedures Procedures (including critical care time)  Medications Ordered in ED Medications  lidocaine (LIDODERM) 5 % 1 patch (1 patch Transdermal Patch Applied 07/25/20 1554)  potassium chloride 10 mEq in 100 mL IVPB (10 mEq Intravenous New Bag/Given 07/25/20 1718)  potassium chloride SA (KLOR-CON) CR tablet 40 mEq (40 mEq Oral Given 07/25/20 1556)  acetaminophen (TYLENOL) tablet 650 mg (650 mg Oral Given 07/25/20 1556)  0.9 %  sodium chloride infusion ( Intravenous New Bag/Given 07/25/20 1612)    ED Course  I have reviewed the triage vital signs and the nursing notes.  Pertinent labs & imaging results that were available during my care of the patient were reviewed by me and considered in my medical decision making (see chart for details).    MDM Rules/Calculators/A&P                          History  provided by patient and daughter with additional history obtained from chart review.    Patient presents to the emergency department with back pain and chest pain. Patient nontoxic appearing, in no apparent distress, vitals without significant abnormality. Fairly benign physical exam. DDX: ACS, pulmonary embolism, dissection, pneumothorax, effusion, infiltrate, arrhythmia, anemia, electrolyte derangement, MSK. Evaluation initiated with labs, EKG, and CXR. Patient on cardiac monitor. Pain on his back is reproducible. Labs were collected in triage. I viewed results which show  no leukocytosis, anemia.  He hypokalemia of 2.6, does have hypokalemia of 2.6, no other significant electrolyte derangement, no renal insufficiency.  Patient given 2 rounds of IV potassium and 40 mg p.o.  Troponins are 19 and 21, flat. CXR without infiltrate, effusion, pneumothorax, or fracture/dislocation. EKG shows normal sinus rhythm, he does have U waves consistent with hypokalemia Heart score of 3.  Patient's presentation is not suggestive of ACS.  Patient is low risk wells, doubt pulmonary embolism. Pain is not a tearing sensation, symmetric pulses, no widening of mediastinum on CXR, doubt dissection. Cardiac monitor reviewed, no notable arrhythmias or tachycardia. Patient has appeared hemodynamically stable throughout ER visit and appears safe for discharge with close pcp follow up.  He has a history of hypokalemia, will discharge with prescription for potassium p.o.  Advised patient to have potassium rechecked after completing prescription by PCP.  I discussed results, treatment plan, need for PCP follow-up, and return precautions with the patient. Provided opportunity for questions, patient confirmed understanding and is in agreement with plan. Findings  and plan of care discussed with supervising physician Dr. Denton Lank    Portions of this note were generated with Dragon dictation software. Dictation errors may occur despite best  attempts at proofreading.  Final Clinical Impression(s) / ED Diagnoses Final diagnoses:  Atypical chest pain  Hypokalemia    Rx / DC Orders ED Discharge Orders         Ordered    potassium chloride (KLOR-CON) 10 MEQ tablet  2 times daily        07/25/20 1815           Sherene Sires, PA-C 07/25/20 1849    Cathren Laine, MD 07/28/20 1507

## 2020-11-09 ENCOUNTER — Other Ambulatory Visit: Payer: Self-pay | Admitting: Family Medicine

## 2020-11-09 DIAGNOSIS — E041 Nontoxic single thyroid nodule: Secondary | ICD-10-CM

## 2020-11-18 ENCOUNTER — Other Ambulatory Visit: Payer: Medicaid Other

## 2020-12-15 ENCOUNTER — Encounter (HOSPITAL_BASED_OUTPATIENT_CLINIC_OR_DEPARTMENT_OTHER): Payer: Self-pay

## 2020-12-15 ENCOUNTER — Emergency Department (HOSPITAL_BASED_OUTPATIENT_CLINIC_OR_DEPARTMENT_OTHER): Payer: Medicaid Other

## 2020-12-15 ENCOUNTER — Other Ambulatory Visit: Payer: Self-pay

## 2020-12-15 ENCOUNTER — Emergency Department (HOSPITAL_BASED_OUTPATIENT_CLINIC_OR_DEPARTMENT_OTHER)
Admission: EM | Admit: 2020-12-15 | Discharge: 2020-12-15 | Disposition: A | Discharge status: Home / Self Care | Payer: Medicaid Other | Attending: Emergency Medicine | Admitting: Emergency Medicine

## 2020-12-15 DIAGNOSIS — R002 Palpitations: Secondary | ICD-10-CM | POA: Insufficient documentation

## 2020-12-15 DIAGNOSIS — R000 Tachycardia, unspecified: Secondary | ICD-10-CM | POA: Diagnosis present

## 2020-12-15 DIAGNOSIS — Z79899 Other long term (current) drug therapy: Secondary | ICD-10-CM | POA: Insufficient documentation

## 2020-12-15 DIAGNOSIS — E876 Hypokalemia: Secondary | ICD-10-CM | POA: Insufficient documentation

## 2020-12-15 DIAGNOSIS — I10 Essential (primary) hypertension: Secondary | ICD-10-CM | POA: Diagnosis not present

## 2020-12-15 HISTORY — DX: Prediabetes: R73.03

## 2020-12-15 LAB — CBC
HCT: 41.2 % (ref 39.0–52.0)
Hemoglobin: 14.3 g/dL (ref 13.0–17.0)
MCH: 33.4 pg (ref 26.0–34.0)
MCHC: 34.7 g/dL (ref 30.0–36.0)
MCV: 96.3 fL (ref 80.0–100.0)
Platelets: 158 10*3/uL (ref 150–400)
RBC: 4.28 MIL/uL (ref 4.22–5.81)
RDW: 13.9 % (ref 11.5–15.5)
WBC: 4 10*3/uL (ref 4.0–10.5)
nRBC: 0 % (ref 0.0–0.2)

## 2020-12-15 LAB — MAGNESIUM: Magnesium: 1.8 mg/dL (ref 1.7–2.4)

## 2020-12-15 LAB — BASIC METABOLIC PANEL
Anion gap: 10 (ref 5–15)
BUN: 15 mg/dL (ref 6–20)
CO2: 27 mmol/L (ref 22–32)
Calcium: 9.3 mg/dL (ref 8.9–10.3)
Chloride: 102 mmol/L (ref 98–111)
Creatinine, Ser: 0.79 mg/dL (ref 0.61–1.24)
GFR, Estimated: >60 mL/min (ref >60)
Glucose, Bld: 97 mg/dL (ref 70–99)
Potassium: 2.5 mmol/L — CL (ref 3.5–5.1)
Sodium: 139 mmol/L (ref 135–145)

## 2020-12-15 LAB — TROPONIN I (HIGH SENSITIVITY)
Troponin I (High Sensitivity): 18 ng/L — ABNORMAL HIGH (ref <18)
Troponin I (High Sensitivity): 20 ng/L — ABNORMAL HIGH (ref <18)

## 2020-12-15 MED ORDER — POTASSIUM CHLORIDE ER 20 MEQ PO TBCR: 20.0000 meq | EXTENDED_RELEASE_TABLET | Freq: Every day | ORAL | 0 refills | Status: DC

## 2020-12-15 MED ORDER — SODIUM CHLORIDE 0.9 % IV SOLN: INTRAVENOUS | Status: DC | PRN

## 2020-12-15 MED ORDER — POTASSIUM CHLORIDE CRYS ER 20 MEQ PO TBCR
40.0000 meq | EXTENDED_RELEASE_TABLET | Freq: Once | ORAL | Status: AC
  Administered 2020-12-15: 40 meq via ORAL
  Filled 2020-12-15: qty 2

## 2020-12-15 MED ORDER — POTASSIUM CHLORIDE 10 MEQ/100ML IV SOLN
10.0000 meq | INTRAVENOUS | Status: DC
Start: 2020-12-15 — End: 2020-12-15
  Administered 2020-12-15 (×4): 10 meq via INTRAVENOUS
  Filled 2020-12-15 (×4): qty 100

## 2020-12-15 NOTE — ED Notes (Signed)
ED Provider at bedside. 

## 2020-12-15 NOTE — ED Provider Notes (Signed)
[facility] EMERGENCY DEPARTMENT Provider Note   CSN: [id number] Arrival date & time: 12/15/20  1121     History Chief Complaint  Patient presents with  . Tachycardia    Kurt Wilkinson is a 55 y.o. male with a past medical history of hypertension, prediabetes, sleep apnea, who presents today for evaluation of feeling like his heart was racing.  He states that this morning he drank chocolate milk and then began to have what he calls the tachycardia.  He states that when he put his hand on his chest he felt like his heart rate was accelerating and beating very fast.  This lasted for about 30 seconds.  He denies any chest pain or pressure.  He denies any leg swelling.  He denies any recent travels, surgeries, or immobilizations.  He denies any changes to his medicines. He states that he has a primary care doctor that he last saw about a month ago.  His PCP is in [place]. He states that he has had his thyroid checked before and this was normal.  He denies any fevers.  No cough, shortness of breath or sick contacts.  He states that currently he does not feel like his heart is racing.   HPI     Past Medical History:  Diagnosis Date  . Hypertension   . Prediabetes   . Sleep apnea     There are no problems to display for this patient.   History reviewed. No pertinent surgical history.     No family history on file.  Social History   Tobacco Use  . Smoking status: Never Smoker  . Smokeless tobacco: Never Used  Vaping Use  . Vaping Use: Never used  Substance Use Topics  . Alcohol use: Yes    Comment: occ  . Drug use: Never    Home Medications Prior to Admission medications   Medication Sig Start Date End Date Taking? Authorizing Provider  amLODipine (NORVASC) 10 MG tablet Take 1 tablet (10 mg total) by mouth daily. 02/18/20  Yes Linwood Dibbles, MD  lisinopril (ZESTRIL) 40 MG tablet Take 40 mg by mouth in the morning and at bedtime.   Yes [provider]   potassium chloride 20 MEQ TBCR Take 20 mEq by mouth daily for 7 days. 12/15/20 12/22/20 Yes Cristina Gong, PA-C  diphenhydrAMINE (BENADRYL) 25 MG tablet Take 2 tablets (50 mg total) by mouth every 6 (six) hours as needed for itching or allergies. 02/18/20   Molpus, John, MD  EPINEPHrine (EPIPEN 2-PAK) 0.3 mg/0.3 mL IJ SOAJ injection Self administer per package instructions as needed for severe allergic reaction and seek medical care. 02/18/20   Molpus, John, MD  oxymetazoline (AFRIN) 0.05 % nasal spray Place 1 spray into both nostrils 2 (two) times daily.    [provider]    Allergies    Parke Simmers allergy]  Review of Systems   Review of Systems  Constitutional: Negative for chills, fatigue and fever.  HENT: Negative for congestion.   Eyes: Negative for visual disturbance.  Respiratory: Negative for cough, chest tightness, shortness of breath and wheezing.   Cardiovascular: Positive for palpitations. Negative for chest pain and leg swelling.  Gastrointestinal: Negative for abdominal pain, diarrhea, nausea and vomiting.  Genitourinary: Negative for dysuria.  Musculoskeletal: Negative for back pain and neck pain.  Skin: Negative for color change.  Neurological: Negative for weakness and headaches.  All other systems reviewed and are negative.   Physical Exam Updated Vital  Signs BP 129/88 (BP Location: Left Arm)   Pulse 74   Temp 98.6 F (37 C) (Oral)   Resp 16   Ht 5\' 9"  (1.753 m)   Wt 90.9 kg   SpO2 100%   BMI 29.59 kg/m   Physical Exam Vitals and nursing note reviewed.  Constitutional:      General: He is not in acute distress.    Appearance: He is not diaphoretic.  HENT:     Head: Normocephalic and atraumatic.  Eyes:     General: No scleral icterus.       Right eye: No discharge.        Left eye: No discharge.     Conjunctiva/sclera: Conjunctivae normal.  Cardiovascular:     Rate and Rhythm: Normal rate and regular rhythm.     Pulses: Normal  pulses.     Heart sounds: Normal heart sounds. No murmur heard.   Pulmonary:     Effort: Pulmonary effort is normal. No respiratory distress.     Breath sounds: Normal breath sounds. No stridor.  Abdominal:     General: There is no distension.     Tenderness: There is no abdominal tenderness. There is no guarding.  Musculoskeletal:        General: No deformity.     Cervical back: Normal range of motion.  Skin:    General: Skin is warm and dry.  Neurological:     Mental Status: He is alert.     Motor: No abnormal muscle tone.     Comments: Patient is awake and alert, answers all questions appropriately.  Speech is not slurred.  Psychiatric:        Behavior: Behavior normal.     ED Results / Procedures / Treatments   Labs (all labs ordered are listed, but only abnormal results are displayed) Labs Reviewed  BASIC METABOLIC PANEL - Abnormal; Notable for the following components:      Result Value   Potassium 2.5 (*)    All other components within normal limits  TROPONIN I (HIGH SENSITIVITY) - Abnormal; Notable for the following components:   Troponin I (High Sensitivity) 18 (*)    All other components within normal limits  TROPONIN I (HIGH SENSITIVITY) - Abnormal; Notable for the following components:   Troponin I (High Sensitivity) 20 (*)    All other components within normal limits  CBC  MAGNESIUM    EKG EKG Interpretation  Date/Time:  Wednesday December 15 2020 11:21:14 EST Ventricular Rate:  96 PR Interval:  128 QRS Duration: 90 QT Interval:  340 QTC Calculation: 429 R Axis:   55 Text Interpretation: Normal sinus rhythm Nonspecific ST and T wave abnormality No significant change since last tracing Confirmed by [id number] (Gwyneth Sprout) on 12/15/2020 11:56:50 AM   Radiology DG Chest 2 View  Result Date: 12/15/2020 CLINICAL DATA:  Tachycardia. EXAM: CHEST - 2 VIEW COMPARISON:  Chest CTA on 02/18/2020 FINDINGS: Heart size is normal. Mediastinal widening and mild  tracheal narrowing is again seen, consistent with substernal goiter as demonstrated on prior chest CT. Both lungs are clear. IMPRESSION: No active cardiopulmonary disease.  Substernal goiter again noted. Electronically Signed   By: 02/20/2020 M.D.   On: 12/15/2020 13:14    Procedures Procedures   Medications Ordered in ED Medications  potassium chloride 10 mEq in 100 mL IVPB (10 mEq Intravenous Not Given 12/15/20 1727)  0.9 %  sodium chloride infusion ( Intravenous Stopped 12/15/20 1726)  potassium chloride SA (KLOR-CON)  CR tablet 40 mEq (40 mEq Oral Given 12/15/20 1345)    ED Course  I have reviewed the triage vital signs and the nursing notes.  Pertinent labs & imaging results that were available during my care of the patient were reviewed by me and considered in my medical decision making (see chart for details).  Clinical Course as of 12/15/20 1803  Wed Dec 15, 2020  1316 Troponin I (High Sensitivity)(!): 18 Consistent with baseline/slightly improved.  [EH]  1345 Potassium(!!): 2.5 [EH]  1551 Troponin I (High Sensitivity)(!): 20 Delta is 2 [EH]    Clinical Course User Index [EH] Norman Clay   MDM Rules/Calculators/A&P                         Patient is a 55 year old Spanish-speaking man who presents today for evaluation of about 30 seconds of feeling his heart race this morning after he drank chocolate milk.  He states that when this has happened in the past he has been hypokalemic.  On my exam he is awake and alert and generally well-appearing.  Regular rate and rhythm.  No murmurs.  Here he is not tachycardic.  He is slightly hypertensive.  Labs are obtained and reviewed, potassium is low at 2.5.  Magnesium is normal.  Initial troponin is 18, repeat is 20, chart review this appears to be his baseline.   He was not having chest pain or discomfort, only the feeling of the palpitations and was not short of breath.  Doubt ACS.  EKG is without acute changes.  Magnesium  is normal at 1.8.  Treated with 4 rounds of IV potassium and 40 meq of p.o. potassium.  He is given continued potassium to go home with.  We discussed the importance of PCP follow-up in the next week to get his potassium rechecked.  He was observed in the emergency room for 6 hours without significant tachycardia or arrhythmia.    Additionally recommended eating high potassium foods.    Return precautions were discussed with patient who states their understanding.  At the time of discharge patient denied any unaddressed complaints or concerns.  Patient is agreeable for discharge home.  Note: Portions of this report may have been transcribed using voice recognition software. Every effort was made to ensure accuracy; however, inadvertent computerized transcription errors may be present   Final Clinical Impression(s) / ED Diagnoses Final diagnoses:  Palpitations  Hypokalemia    Rx / DC Orders ED Discharge Orders         Ordered    potassium chloride 20 MEQ TBCR  Daily        12/15/20 1704           Norman Clay 12/15/20 1807    Gwyneth Sprout, MD 12/19/20 1312

## 2020-12-15 NOTE — ED Triage Notes (Signed)
Through video Spanish interpreter pt c/o "heart racing" started ~9am-denies CP-denies fever/flu sx-states he has hx of same ~2 years ago-denies hx of cardioversion-states he was last seen by cards ~3 months ago-NAD-steady gait

## 2020-12-15 NOTE — ED Notes (Signed)
Per E. Jeraldine Loots do not give 5th bag of K+

## 2020-12-15 NOTE — Discharge Instructions (Addendum)
Please get your potassium rechecked in 1 week.  Your potassium was low today.  I have given you a weeks worth of potassium pills to take. Please eat high potassium foods.   Please take the potassium that I am giving you for the week and do not take your home potassium.  If you take both the potassium that I am giving you and the one you have at home that may be too much for you.  To finish the potassium that I have given you please restart your normal home potassium.  If you develop chest pain, your symptoms return, or you have other concerns please seek additional medical care and evaluation.

## 2020-12-15 NOTE — ED Notes (Signed)
Pt here with complaints of tachycardia. Patient states that he drank chocolate milk this morning and then began to have a "tachycardia".  Pt states that he has history of tachycardia

## 2021-05-03 ENCOUNTER — Emergency Department (HOSPITAL_BASED_OUTPATIENT_CLINIC_OR_DEPARTMENT_OTHER): Payer: Medicaid Other

## 2021-05-03 ENCOUNTER — Other Ambulatory Visit: Payer: Self-pay

## 2021-05-03 ENCOUNTER — Encounter (HOSPITAL_BASED_OUTPATIENT_CLINIC_OR_DEPARTMENT_OTHER): Payer: Self-pay | Admitting: *Deleted

## 2021-05-03 ENCOUNTER — Emergency Department (HOSPITAL_BASED_OUTPATIENT_CLINIC_OR_DEPARTMENT_OTHER)
Admission: EM | Admit: 2021-05-03 | Discharge: 2021-05-04 | Disposition: A | Discharge status: Home / Self Care | Payer: Medicaid Other | Attending: Emergency Medicine | Admitting: Emergency Medicine

## 2021-05-03 DIAGNOSIS — Z79899 Other long term (current) drug therapy: Secondary | ICD-10-CM | POA: Insufficient documentation

## 2021-05-03 DIAGNOSIS — R079 Chest pain, unspecified: Secondary | ICD-10-CM

## 2021-05-03 DIAGNOSIS — E876 Hypokalemia: Secondary | ICD-10-CM | POA: Insufficient documentation

## 2021-05-03 DIAGNOSIS — R002 Palpitations: Secondary | ICD-10-CM

## 2021-05-03 DIAGNOSIS — I10 Essential (primary) hypertension: Secondary | ICD-10-CM | POA: Diagnosis not present

## 2021-05-03 LAB — BASIC METABOLIC PANEL
Anion gap: 8 (ref 5–15)
BUN: 14 mg/dL (ref 6–20)
CO2: 30 mmol/L (ref 22–32)
Calcium: 9 mg/dL (ref 8.9–10.3)
Chloride: 101 mmol/L (ref 98–111)
Creatinine, Ser: 0.86 mg/dL (ref 0.61–1.24)
GFR, Estimated: >60 mL/min (ref >60)
Glucose, Bld: 129 mg/dL — ABNORMAL HIGH (ref 70–99)
Potassium: 2.6 mmol/L — CL (ref 3.5–5.1)
Sodium: 139 mmol/L (ref 135–145)

## 2021-05-03 LAB — CBC
HCT: 40 % (ref 39.0–52.0)
Hemoglobin: 14.1 g/dL (ref 13.0–17.0)
MCH: 34.1 pg — ABNORMAL HIGH (ref 26.0–34.0)
MCHC: 35.3 g/dL (ref 30.0–36.0)
MCV: 96.9 fL (ref 80.0–100.0)
Platelets: 174 10*3/uL (ref 150–400)
RBC: 4.13 MIL/uL — ABNORMAL LOW (ref 4.22–5.81)
RDW: 14.6 % (ref 11.5–15.5)
WBC: 5.4 10*3/uL (ref 4.0–10.5)
nRBC: 0 % (ref 0.0–0.2)

## 2021-05-03 LAB — TROPONIN I (HIGH SENSITIVITY): Troponin I (High Sensitivity): 14 ng/L (ref <18)

## 2021-05-03 NOTE — ED Triage Notes (Signed)
Chest pain x 30 mins after eating fish. Feels like palpitations

## 2021-05-04 LAB — TROPONIN I (HIGH SENSITIVITY): Troponin I (High Sensitivity): 17 ng/L (ref <18)

## 2021-05-04 MED ORDER — POTASSIUM CHLORIDE ER 20 MEQ PO TBCR
20.0000 meq | EXTENDED_RELEASE_TABLET | Freq: Two times a day (BID) | ORAL | 0 refills | Status: DC
Start: 2021-05-04 — End: 2023-06-19

## 2021-05-04 MED ORDER — POTASSIUM CHLORIDE 10 MEQ/100ML IV SOLN
10.0000 meq | Freq: Once | INTRAVENOUS | Status: AC
  Administered 2021-05-04: 10 meq via INTRAVENOUS
  Filled 2021-05-04: qty 100

## 2021-05-04 MED ORDER — POTASSIUM CHLORIDE CRYS ER 20 MEQ PO TBCR
40.0000 meq | EXTENDED_RELEASE_TABLET | Freq: Once | ORAL | Status: AC
  Administered 2021-05-04: 40 meq via ORAL
  Filled 2021-05-04: qty 2

## 2021-05-04 NOTE — ED Provider Notes (Signed)
[facility] Provider Note: Kurt Dell, MD, FACEP  CSN: [id number] MRN: [id number] ARRIVAL: 05/03/21 at 2102 ROOM: [facility]07/[facility]07   CHIEF COMPLAINT  Chest Pain   HISTORY OF PRESENT ILLNESS  05/04/21 12:15 AM Kurt Wilkinson is a 55 y.o. male who had the onset of palpitations (rapid heartbeat) and chest pain about 8 PM.  The pain is described as a pressure and was a 7 out of 10.  There was no associated shortness of breath, diaphoresis or nausea.  Nothing made the symptoms better or worse but they did wax and wane.  They resolved about 2 hours later and he has been asymptomatic since.  He states he had similar symptoms in the past when his potassium was low.   Past Medical History:  Diagnosis Date   Hypertension    Prediabetes    Sleep apnea     History reviewed. No pertinent surgical history.  No family history on file.  Social History   Tobacco Use   Smoking status: Never   Smokeless tobacco: Never  Vaping Use   Vaping Use: Never used  Substance Use Topics   Alcohol use: Yes    Comment: occ   Drug use: Never    Prior to Admission medications   Medication Sig Start Date End Date Taking? Authorizing Provider  amLODipine (NORVASC) 10 MG tablet Take 1 tablet (10 mg total) by mouth daily. 02/18/20   Linwood Dibbles, MD  diphenhydrAMINE (BENADRYL) 25 MG tablet Take 2 tablets (50 mg total) by mouth every 6 (six) hours as needed for itching or allergies. 02/18/20   Kwasi Joung, MD  EPINEPHrine (EPIPEN 2-PAK) 0.3 mg/0.3 mL IJ SOAJ injection Self administer per package instructions as needed for severe allergic reaction and seek medical care. 02/18/20   Camielle Sizer, MD  lisinopril (ZESTRIL) 40 MG tablet Take 40 mg by mouth in the morning and at bedtime.    [provider]  oxymetazoline (AFRIN) 0.05 % nasal spray Place 1 spray into both nostrils 2 (two) times daily.    [provider]  Potassium Chloride ER 20 MEQ TBCR Take 20 mEq by mouth in the morning  and at bedtime for 5 days. 05/04/21 05/09/21  Jaheim Canino, MD    Allergies Parke Simmers allergy]   REVIEW OF SYSTEMS  Negative except as noted here or in the History of Present Illness.   PHYSICAL EXAMINATION  Initial Vital Signs Blood pressure (!) 145/83, pulse 86, temperature 98.5 F (36.9 C), temperature source Oral, resp. rate 20, SpO2 100 %.  Examination General: Well-developed, well-nourished male in no acute distress; appearance consistent with age of record HENT: normocephalic; atraumatic Eyes: pupils equal, round and reactive to light; extraocular muscles intact Neck: supple Heart: regular rate and rhythm Lungs: clear to auscultation bilaterally Abdomen: soft; nondistended; nontender; bowel sounds present Extremities: No deformity; full range of motion; pulses normal Neurologic: Awake, alert and oriented; motor function intact in all extremities and symmetric; no facial droop Skin: Warm and dry Psychiatric: Normal mood and affect   RESULTS  Summary of this visit's results, reviewed and interpreted by myself:   EKG Interpretation  Date/Time:  Tuesday May 03 2021 21:10:47 EDT Ventricular Rate:  86 PR Interval:  128 QRS Duration: 90 QT Interval:  330 QTC Calculation: 394 R Axis:   58 Text Interpretation: Normal sinus rhythm T wave abnormality, consider inferior ischemia Abnormal ECG No significant change was found Confirmed by Babs Dabbs ([id number]) on 05/04/2021 12:15:00 AM  Laboratory Studies: Results for orders placed or performed during the hospital encounter of 05/03/21 (from the past 24 hour(s))  Basic metabolic panel     Status: Abnormal   Collection Time: 05/03/21  9:17 PM  Result Value Ref Range   Sodium 139 135 - 145 mmol/L   Potassium 2.6 (LL) 3.5 - 5.1 mmol/L   Chloride 101 98 - 111 mmol/L   CO2 30 22 - 32 mmol/L   Glucose, Bld 129 (H) 70 - 99 mg/dL   BUN 14 6 - 20 mg/dL   Creatinine, Ser 5.80 0.61 - 1.24 mg/dL   Calcium 9.0 8.9 -  99.8 mg/dL   GFR, Estimated >33 >82 mL/min   Anion gap 8 5 - 15  CBC     Status: Abnormal   Collection Time: 05/03/21  9:17 PM  Result Value Ref Range   WBC 5.4 4.0 - 10.5 K/uL   RBC 4.13 (L) 4.22 - 5.81 MIL/uL   Hemoglobin 14.1 13.0 - 17.0 g/dL   HCT 50.5 39.7 - 67.3 %   MCV 96.9 80.0 - 100.0 fL   MCH 34.1 (H) 26.0 - 34.0 pg   MCHC 35.3 30.0 - 36.0 g/dL   RDW 41.9 37.9 - 02.4 %   Platelets 174 150 - 400 K/uL   nRBC 0.0 0.0 - 0.2 %  Troponin I (High Sensitivity)     Status: None   Collection Time: 05/03/21  9:17 PM  Result Value Ref Range   Troponin I (High Sensitivity) 14 <18 ng/L  Troponin I (High Sensitivity)     Status: None   Collection Time: 05/03/21 11:20 PM  Result Value Ref Range   Troponin I (High Sensitivity) 17 <18 ng/L   Imaging Studies: DG Chest 2 View  Result Date: 05/03/2021 CLINICAL DATA:  Chest pain EXAM: CHEST - 2 VIEW COMPARISON:  Radiograph 12/15/2020 FINDINGS: No consolidation, features of edema, pneumothorax, or effusion. Redemonstration of substernal goiter. Remaining cardiomediastinal contours are unremarkable. Multilevel degenerative changes are present in the imaged portions of the spine. No acute or worrisome osseous abnormality or chest wall findings. IMPRESSION: No acute cardiopulmonary abnormality. Electronically Signed   By: Kreg Shropshire M.D.   On: 05/03/2021 21:58    ED COURSE and MDM  Nursing notes, initial and subsequent vitals signs, including pulse oximetry, reviewed and interpreted by myself.  Vitals:   05/03/21 2113 05/03/21 2249 05/03/21 2341 05/04/21 0000  BP: (!) 155/84 (!) 153/108 (!) 145/83 (!) 151/96  Pulse: 90 87 86 77  Resp: 20 (!) 22 20 (!) 22  Temp: 98.5 F (36.9 C)     TempSrc: Oral     SpO2: 100% 100% 100% 98%   Medications  potassium chloride 10 mEq in 100 mL IVPB (10 mEq Intravenous New Bag/Given 05/04/21 0037)  potassium chloride SA (KLOR-CON) CR tablet 40 mEq (40 mEq Oral Given 05/04/21 0037)   12:28 AM Patient's  potassium is low as the patient himself suspected.  We will go ahead and give IV and oral potassium pending second troponin.  12:36 AM Troponins are both within normal limits.  This patient's chest pain was only present during the time he had palpitations.  This is likely a consequence of his hypokalemia.  He does have a PCP with whom he can follow-up.  He was advised to follow-up in 1 to 2 weeks to have his potassium rechecked.  He was advised he may need specialty consult to determine why his potassium keeps dropping.  PROCEDURES  Procedures  ED DIAGNOSES     ICD-10-CM   1. Hypokalemia  E87.6     2. Rapid palpitations  R00.2     3. Nonspecific chest pain  R07.9          Yuliet Needs, Jonny Ruiz, MD 05/04/21 [phone]

## 2022-09-16 ENCOUNTER — Emergency Department (HOSPITAL_BASED_OUTPATIENT_CLINIC_OR_DEPARTMENT_OTHER)
Admission: EM | Admit: 2022-09-16 | Discharge: 2022-09-16 | Disposition: A | Discharge status: Home / Self Care | Payer: Commercial Managed Care - HMO | Attending: Emergency Medicine | Admitting: Emergency Medicine

## 2022-09-16 ENCOUNTER — Other Ambulatory Visit: Payer: Self-pay

## 2022-09-16 ENCOUNTER — Encounter (HOSPITAL_BASED_OUTPATIENT_CLINIC_OR_DEPARTMENT_OTHER): Payer: Self-pay | Admitting: Emergency Medicine

## 2022-09-16 DIAGNOSIS — K625 Hemorrhage of anus and rectum: Secondary | ICD-10-CM | POA: Insufficient documentation

## 2022-09-16 DIAGNOSIS — K6289 Other specified diseases of anus and rectum: Secondary | ICD-10-CM

## 2022-09-16 LAB — URINALYSIS, ROUTINE W REFLEX MICROSCOPIC
Bilirubin Urine: NEGATIVE
Glucose, UA: NEGATIVE mg/dL
Ketones, ur: NEGATIVE mg/dL
Leukocytes,Ua: NEGATIVE
Nitrite: NEGATIVE
Protein, ur: NEGATIVE mg/dL
Specific Gravity, Urine: 1.01 (ref 1.005–1.030)
pH: 6 (ref 5.0–8.0)

## 2022-09-16 LAB — CBC WITH DIFFERENTIAL/PLATELET
Abs Immature Granulocytes: 0 10*3/uL (ref 0.00–0.07)
Basophils Absolute: 0 10*3/uL (ref 0.0–0.1)
Basophils Relative: 1 %
Eosinophils Absolute: 0.1 10*3/uL (ref 0.0–0.5)
Eosinophils Relative: 1 %
HCT: 39.2 % (ref 39.0–52.0)
Hemoglobin: 13.4 g/dL (ref 13.0–17.0)
Immature Granulocytes: 0 %
Lymphocytes Relative: 35 %
Lymphs Abs: 1.5 10*3/uL (ref 0.7–4.0)
MCH: 33.3 pg (ref 26.0–34.0)
MCHC: 34.2 g/dL (ref 30.0–36.0)
MCV: 97.3 fL (ref 80.0–100.0)
Monocytes Absolute: 0.7 10*3/uL (ref 0.1–1.0)
Monocytes Relative: 16 %
Neutro Abs: 2 10*3/uL (ref 1.7–7.7)
Neutrophils Relative %: 47 %
Platelets: 165 10*3/uL (ref 150–400)
RBC: 4.03 MIL/uL — ABNORMAL LOW (ref 4.22–5.81)
RDW: 14.8 % (ref 11.5–15.5)
WBC: 4.2 10*3/uL (ref 4.0–10.5)
nRBC: 0 % (ref 0.0–0.2)

## 2022-09-16 LAB — BASIC METABOLIC PANEL
Anion gap: 6 (ref 5–15)
BUN: 17 mg/dL (ref 6–20)
CO2: 31 mmol/L (ref 22–32)
Calcium: 8.6 mg/dL — ABNORMAL LOW (ref 8.9–10.3)
Chloride: 104 mmol/L (ref 98–111)
Creatinine, Ser: 0.92 mg/dL (ref 0.61–1.24)
GFR, Estimated: >60 mL/min (ref >60)
Glucose, Bld: 101 mg/dL — ABNORMAL HIGH (ref 70–99)
Potassium: 3.2 mmol/L — ABNORMAL LOW (ref 3.5–5.1)
Sodium: 141 mmol/L (ref 135–145)

## 2022-09-16 LAB — URINALYSIS, MICROSCOPIC (REFLEX)

## 2022-09-16 MED ORDER — LIDOCAINE HCL 2 % EX GEL: 1.0000 | CUTANEOUS | 0 refills | Status: AC | PRN

## 2022-09-16 MED ORDER — LIDOCAINE HCL URETHRAL/MUCOSAL 2 % EX GEL
1.0000 | Freq: Once | CUTANEOUS | Status: AC
  Administered 2022-09-16: 1 via TOPICAL
  Filled 2022-09-16: qty 11

## 2022-09-16 MED ORDER — DOCUSATE SODIUM 100 MG PO CAPS: 100.0000 mg | ORAL_CAPSULE | Freq: Two times a day (BID) | ORAL | 0 refills | Status: AC

## 2022-09-16 MED ORDER — HYDROCORTISONE ACETATE 25 MG RE SUPP: 25.0000 mg | Freq: Two times a day (BID) | RECTAL | 0 refills | Status: AC

## 2022-09-16 MED ORDER — ACETAMINOPHEN 500 MG PO TABS
1000.0000 mg | ORAL_TABLET | Freq: Once | ORAL | Status: AC
  Administered 2022-09-16: 1000 mg via ORAL
  Filled 2022-09-16: qty 2

## 2022-09-16 NOTE — Discharge Instructions (Signed)
Please read and follow all provided instructions.  Your diagnoses today include:  1. Rectal pain     Tests performed today include: Complete blood cell count: normal red and white blood cell counts Basic metabolic panel: slightly low potassium Vital signs. See below for your results today.   Medications prescribed:  Colace - stool softener  This medication can be found over-the-counter.   Continue colace for 2 weeks after your stools return to normal to prevent constipation.   Topical lidocaine  Anusol suppository  Take any medications only as directed on prescription or on packaging.   Home care instructions:  Follow any educational materials contained in this packet.  Follow-up instructions: Please follow-up with your primary care provider in the next week for further evaluation of your symptoms.   Return instructions:  Please return to the Emergency Department if you experience worsening symptoms.  Please return if you have worsening abdominal pain, swelling of your abdomen, persistent vomiting, blood in your stool or vomit, or fever.  Please return if you have any other emergent concerns.  Additional Information:  Your vital signs today were: BP (!) 159/93 (BP Location: Left Arm)   Pulse 92   Temp 98 F (36.7 C) (Oral)   Resp 19   Ht 5\' 9"  (1.753 m)   Wt 90.7 kg   SpO2 99%   BMI 29.53 kg/m  If your blood pressure (BP) was elevated above 135/85 this visit, please have this repeated by your doctor within one month. --------------

## 2022-09-16 NOTE — ED Provider Notes (Signed)
[facility] EMERGENCY DEPARTMENT Provider Note   CSN: [id number] Arrival date & time: 09/16/22  8242     History  Chief Complaint  Patient presents with   Rectal Bleeding    Kurt Wilkinson is a 56 y.o. male.  Patient presents to the emergency department for evaluation of rectal pain.  Spanish video interpreter used for interview.  Patient states that he developed some pain with having a bowel movement 2 days ago.  He noted a small amount of blood on the toilet paper that was bright red.  He states that yesterday he used a suppository that he had leftover from when he had a colonoscopy in the past.  Again this morning, he had a bowel movement where he had to strain somewhat, and also noted blood again.  He describes developing a more significant burning pain that is isolated to the rectum and a little bit inside.  Denies blood in the urine or any urinary difficulties.  No vomiting, chest pain, or anterior abdominal pain.  Onset of symptoms acute.  Course is constant.       Home Medications Prior to Admission medications   Medication Sig Start Date End Date Taking? Authorizing Provider  amLODipine (NORVASC) 10 MG tablet Take 1 tablet (10 mg total) by mouth daily. 02/18/20  Yes Linwood Dibbles, MD  lisinopril (ZESTRIL) 40 MG tablet Take 40 mg by mouth in the morning and at bedtime.   Yes [provider]  diphenhydrAMINE (BENADRYL) 25 MG tablet Take 2 tablets (50 mg total) by mouth every 6 (six) hours as needed for itching or allergies. 02/18/20   Molpus, John, MD  EPINEPHrine (EPIPEN 2-PAK) 0.3 mg/0.3 mL IJ SOAJ injection Self administer per package instructions as needed for severe allergic reaction and seek medical care. 02/18/20   Molpus, John, MD  oxymetazoline (AFRIN) 0.05 % nasal spray Place 1 spray into both nostrils 2 (two) times daily.    [provider]  Potassium Chloride ER 20 MEQ TBCR Take 20 mEq by mouth in the morning and at bedtime for 5 days. 05/04/21  05/09/21  Molpus, Jonny Ruiz, MD      Allergies    Parke Simmers allergy]    Review of Systems   Review of Systems  Physical Exam Updated Vital Signs BP (!) 159/93 (BP Location: Left Arm)   Pulse 92   Temp 98 F (36.7 C) (Oral)   Resp 19   Ht 5\' 9"  (1.753 m)   Wt 90.7 kg   SpO2 99%   BMI 29.53 kg/m   Physical Exam Vitals and nursing note reviewed.  Constitutional:      General: He is not in acute distress.    Appearance: He is well-developed.  HENT:     Head: Normocephalic and atraumatic.  Eyes:     General:        Right eye: No discharge.        Left eye: No discharge.     Conjunctiva/sclera: Conjunctivae normal.  Cardiovascular:     Rate and Rhythm: Normal rate and regular rhythm.     Heart sounds: Normal heart sounds.  Pulmonary:     Effort: Pulmonary effort is normal.     Breath sounds: Normal breath sounds.  Abdominal:     Palpations: Abdomen is soft.     Tenderness: There is no abdominal tenderness. There is no guarding or rebound.  Genitourinary:    Comments: External exam performed with RN chaperone.  Rectal exam performed.  Patient  had topical steroid cream on the area that was wiped off with some gauze.  I noted a small nonthrombosed hemorrhoid at the 6 o'clock position, nonbleeding.  Around the anus the area was generally inflamed and tender.  No obvious bleeding or fissuring noted but patient exquisitely tender to palpation. Musculoskeletal:     Cervical back: Normal range of motion and neck supple.  Skin:    General: Skin is warm and dry.  Neurological:     Mental Status: He is alert.     ED Results / Procedures / Treatments   Labs (all labs ordered are listed, but only abnormal results are displayed) Labs Reviewed  URINALYSIS, ROUTINE W REFLEX MICROSCOPIC - Abnormal; Notable for the following components:      Result Value   Hgb urine dipstick TRACE (*)    All other components within normal limits  URINALYSIS, MICROSCOPIC (REFLEX) - Abnormal;  Notable for the following components:   Bacteria, UA FEW (*)    All other components within normal limits  CBC WITH DIFFERENTIAL/PLATELET  BASIC METABOLIC PANEL    EKG None  Radiology No results found.  Procedures Procedures    Medications Ordered in ED Medications  lidocaine (XYLOCAINE) 2 % jelly 1 Application (1 Application Topical Given by Other 09/16/22 0952)  acetaminophen (TYLENOL) tablet 1,000 mg (1,000 mg Oral Given 09/16/22 9390)    ED Course/ Medical Decision Making/ A&P    Patient seen and examined. History obtained directly from patient.   Labs/EKG: Will check CBC and BMP to get baseline hemoglobin and to ensure no long-term blood loss, although at this point it sounds like amount of blood loss is small and this is likely a problem related to the rectum.  Imaging: None ordered  Medications/Fluids: Tylenol for pain, topical lidocaine to apply during exam  Most recent vital signs reviewed and are as follows: BP (!) 159/93 (BP Location: Left Arm)   Pulse 92   Temp 98 F (36.7 C) (Oral)   Resp 19   Ht 5\' 9"  (1.753 m)   Wt 90.7 kg   SpO2 99%   BMI 29.53 kg/m   Initial impression: Rectal pain, likely hemorrhoids or anal fissure.  Abdominal exam is reassuring.  Vital signs reassuring.  Awaiting RN chaperone to perform rectal exam.  10:06 AM Rectal exam performed with RN chaperone. After exam I applied topical lidocaine jelly and secured with gauze.   10:53 AM Reassessment performed. Patient appears stable.  He states that the topical medicine helped.  Labs personally reviewed and interpreted including: CBC with normal white blood cell count, normal hemoglobin 13.4; BMP with mild hypokalemia 3.2.  UA unremarkable.  Reviewed pertinent lab work and imaging with patient at bedside. Questions answered.   Most current vital signs reviewed and are as follows: BP (!) 159/93 (BP Location: Left Arm)   Pulse 92   Temp 98 F (36.7 C) (Oral)   Resp 19   Ht 5\' 9"   (1.753 m)   Wt 90.7 kg   SpO2 99%   BMI 29.53 kg/m   Plan: Discharge to home.   Prescriptions written for: Anusol suppository, lidocaine jelly, Colace  Other home care instructions discussed: Discussed use of sitz bath's  ED return instructions discussed: Follow-up with worsening pain, fevers, abdominal pain or other concerns.  Follow-up instructions discussed: Patient encouraged to follow-up with their PCP in 7 days.  Medical Decision Making Amount and/or Complexity of Data Reviewed Labs: ordered.  Risk OTC drugs.   Patient with rectal pain with some bleeding.  Normal hemoglobin.  Lower GI bleeding suspected given passage of heme positive stool, bright red blood per rectum, hematochezia (maroon stool), clots noted in stool, red blood on toilet paper or in toilet bowl, no melanotic stools, presentation not consistent with a large upper GI bleed.  The following differential diagnoses were considered for this patient's lower GI bleed: *Hemorrhoids - can be painless, blood noted on toilet paper *Anal fissure - tearing pain with defecation, small amount of blood *Colonic polyp - usually painless *Proctitis - usually associated with passage of mucus and diarrhea *IBD - presents with crampy abdominal pain, tenesmus, fever *Infectious diarrhea - usually abrupt onset* *Diverticulosis - large volume of painless bleeding, >40yo *Mesenteric ischemia - >50yo, underlying cardiovascular disease, pain out of proportion *Colon cancer *Arteriovenous malformation  None of the following red flags identified or suspected: *Abnormal vital signs *Symptoms suggestive of malignancy such as constitutional symptoms (fever, weight loss), anemia, or change in frequency, caliber or consistency of stools  The patient's vital signs, pertinent lab work and imaging were reviewed and interpreted as discussed in the ED course. Hospitalization was considered for further  testing, treatments, or serial exams/observation. However as patient is well-appearing, has a stable exam, and reassuring studies today, I do not feel that they warrant admission at this time. This plan was discussed with the patient who verbalizes agreement and comfort with this plan and seems reliable and able to return to the Emergency Department with worsening or changing symptoms.   Follow-up: Age greater than 40 -- GI/PCP referral         Final Clinical Impression(s) / ED Diagnoses Final diagnoses:  Rectal pain    Rx / DC Orders ED Discharge Orders          Ordered    hydrocortisone (ANUSOL-HC) 25 MG suppository  2 times daily        09/16/22 1052    lidocaine (XYLOCAINE) 2 % jelly  As needed        09/16/22 1052    docusate sodium (COLACE) 100 MG capsule  Every 12 hours        09/16/22 1052              Renne Crigler, PA-C 09/16/22 1056    Rondel Baton, MD 09/24/22 1203

## 2022-09-16 NOTE — ED Triage Notes (Addendum)
States had bright red blood on tissue paper after having a stool this am. " Hard stool" has been using Hydrocortisone 2.5 % cream to rectal area . Pain to rectal area and pressure when urinating this am

## 2023-02-15 IMAGING — CR DG CHEST 2V
2 series · 2 of 2 positions shown · non-contrast
Comparison: Radiograph 12/15/2020

CLINICAL DATA: Chest pain

EXAM:
CHEST - 2 VIEW

[w chest pa]
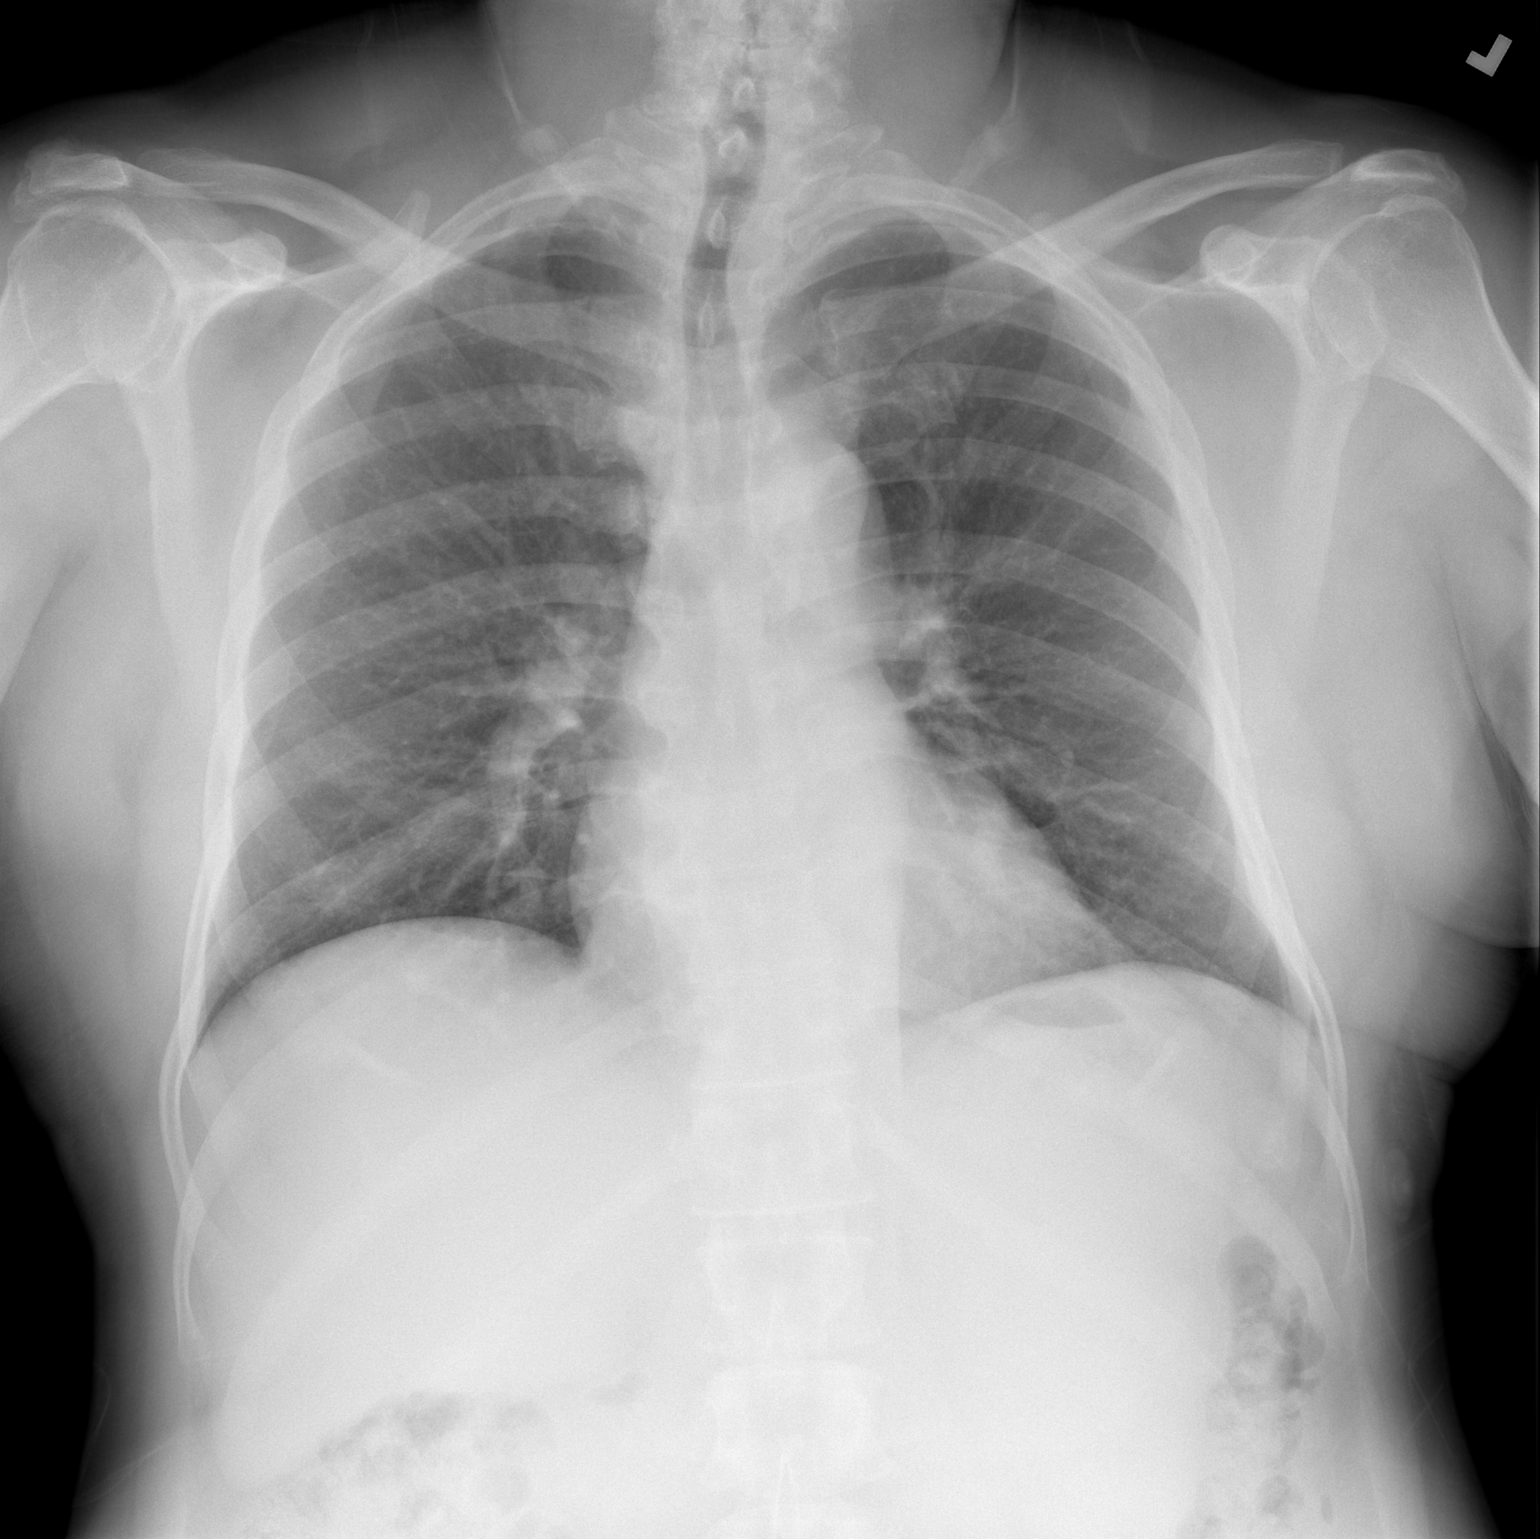

[w chest lat]
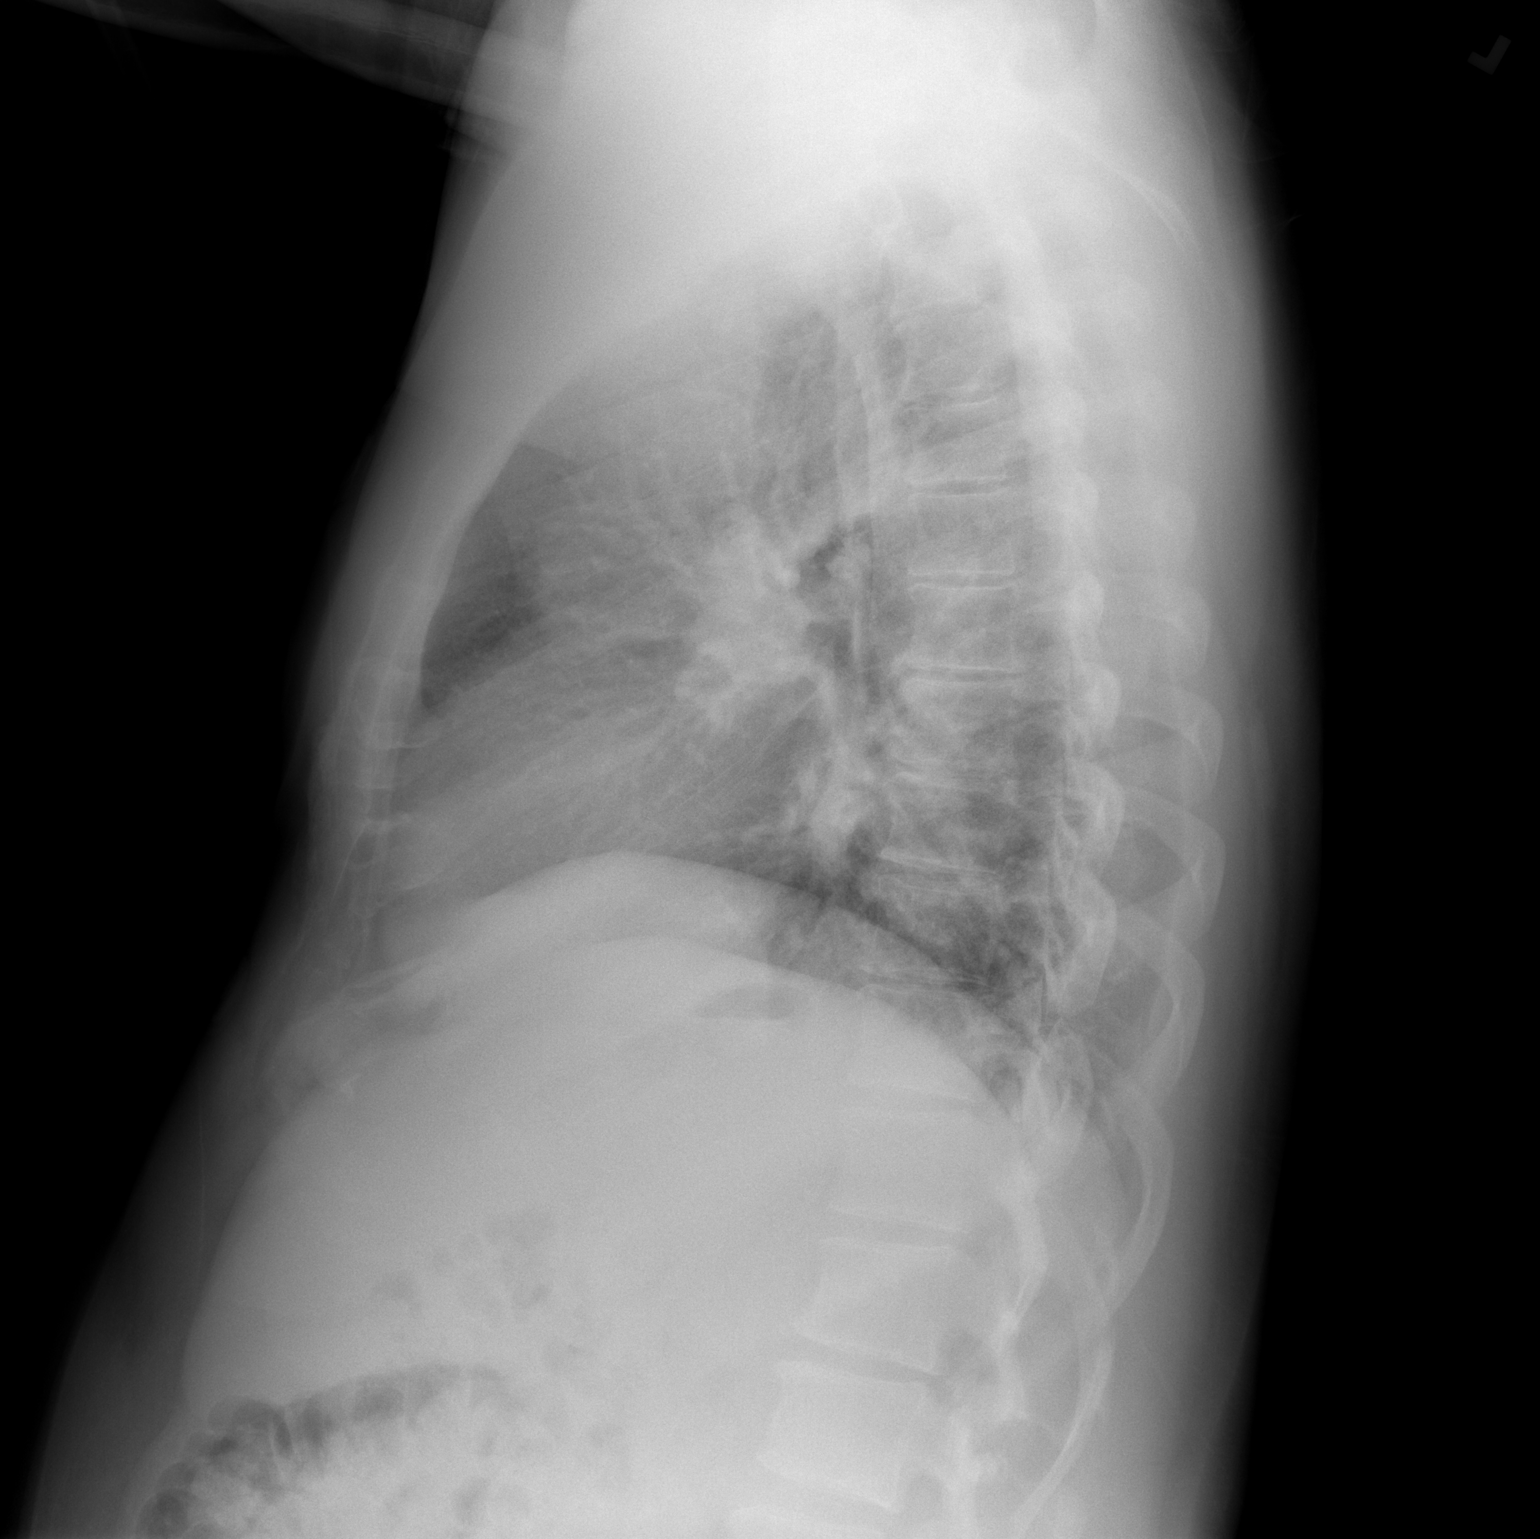

[2 of 2 positions shown; findings below may reference images not displayed]

FINDINGS: No consolidation, features of edema, pneumothorax, or effusion.
Redemonstration of substernal goiter. Remaining cardiomediastinal
contours are unremarkable. Multilevel degenerative changes are
present in the imaged portions of the spine. No acute or worrisome
osseous abnormality or chest wall findings.
IMPRESSION: No acute cardiopulmonary abnormality.

## 2023-04-24 NOTE — Progress Notes (Signed)
Cardiology Office Note:    Date:  04/24/2023   ID:  Kurt Wilkinson, DOB [birth year], MRN [id number]  PCP:  Patient, No Pcp Per   [facility] Providers Cardiologist:  Alverda Skeans, MD Referring MD: No ref. provider found   Chief Complaint/Reason for Referral: Chest pain  PATIENT DID NOT APPEAR FOR APPOINTMENT   ASSESSMENT:    1. Precordial pain   2. Primary hypertension   3. Routine adult health maintenance     PLAN:    In order of problems listed above: 1.  Chest pain:  We will obtain a coronary CTA and echocardiogram to evaluate further.  If the patient has mild obstructive coronary artery disease, they will require a statin (with goal LDL < 70) and aspirin, if they have high-grade disease we will need to consider optimal medical therapy and if symptoms are refractory to medical therapy, then a cardiac catheterization with possible PCI will be pursued to alleviate symptoms.  If they have high risk disease we will proceed directly to cardiac catheterization.   2.  Hypertension: 3.  Health maintenance: Will check lipid panel and LP(a) today.            Dispo:  No follow-ups on file.      Medication Adjustments/Labs and Tests Ordered: Current medicines are reviewed at length with the patient today.  Concerns regarding medicines are outlined above.  The following changes have been made:     Labs/tests ordered: No orders of the defined types were placed in this encounter.   Medication Changes: No orders of the defined types were placed in this encounter.       History of Present Illness:    FOCUSED PROBLEM LIST:   1.  Hypertension  The patient is a 57 y.o. male with the indicated medical history here for recommendations regarding chest pain.  On review of records available the patient was evaluated in the emergency in July 2022 for chest pain.  He also complained of palpitations.  His troponins were very mildly elevated without a delta.  EKG demonstrated  sinus rhythm with nonspecific ST and T wave abnormality.  He was discharged home.       Previous Medical History: Past Medical History:  Diagnosis Date   Hypertension    Prediabetes    Sleep apnea      Current Medications: No outpatient medications have been marked as taking for the 04/25/23 encounter (Appointment) with Orbie Pyo, MD.     Allergies:    Parke Simmers allergy]   Social History:   Social History   Tobacco Use   Smoking status: Never   Smokeless tobacco: Never  Vaping Use   Vaping Use: Never used  Substance Use Topics   Alcohol use: Yes    Comment: occ   Drug use: Never     Family Hx: No family history on file.   Review of Systems:   Please see the history of present illness.    All other systems reviewed and are negative.     EKGs/Labs/Other Test Reviewed:    EKG:    EKG Interpretation Date/Time:    Ventricular Rate:    PR Interval:    QRS Duration:    QT Interval:    QTC Calculation:   R Axis:      Text Interpretation:           Prior CV studies: None available     Other studies Reviewed: Review of the additional studies/records demonstrates: No  aortic atherosclerosis or coronary artery calcification seen on imaging studies available.  Recent Labs: 2022/10/06: BUN 17; Creatinine, Ser 0.92; Hemoglobin 13.4; Platelets 165; Potassium 3.2; Sodium 141   Recent Lipid Panel No results found for: "CHOL", "TRIG", "HDL", "LDLCALC", "LDLDIRECT"  Risk Assessment/Calculations:      Physical Exam:      Signed, Orbie Pyo, MD  04/24/2023 6:27 PM    [facility] [address] [place], [place], [place]  [address] Phone: [phone]; Fax: [phone]   Note:  This document was prepared using Dragon voice recognition software and may include unintentional dictation errors.

## 2023-04-25 ENCOUNTER — Ambulatory Visit: Payer: Self-pay | Attending: Internal Medicine | Admitting: Internal Medicine

## 2023-04-25 DIAGNOSIS — I10 Essential (primary) hypertension: Secondary | ICD-10-CM

## 2023-04-25 DIAGNOSIS — Z0000 Encounter for general adult medical examination without abnormal findings: Secondary | ICD-10-CM

## 2023-04-25 DIAGNOSIS — R072 Precordial pain: Secondary | ICD-10-CM

## 2023-06-19 ENCOUNTER — Encounter (HOSPITAL_BASED_OUTPATIENT_CLINIC_OR_DEPARTMENT_OTHER): Payer: Self-pay | Admitting: Emergency Medicine

## 2023-06-19 ENCOUNTER — Emergency Department (HOSPITAL_BASED_OUTPATIENT_CLINIC_OR_DEPARTMENT_OTHER): Payer: PRIVATE HEALTH INSURANCE

## 2023-06-19 ENCOUNTER — Emergency Department (HOSPITAL_BASED_OUTPATIENT_CLINIC_OR_DEPARTMENT_OTHER)
Admission: EM | Admit: 2023-06-19 | Discharge: 2023-06-20 | Disposition: A | Discharge status: Home / Self Care | Payer: PRIVATE HEALTH INSURANCE | Attending: Emergency Medicine | Admitting: Emergency Medicine

## 2023-06-19 ENCOUNTER — Other Ambulatory Visit: Payer: Self-pay

## 2023-06-19 DIAGNOSIS — E876 Hypokalemia: Secondary | ICD-10-CM | POA: Insufficient documentation

## 2023-06-19 DIAGNOSIS — R1032 Left lower quadrant pain: Secondary | ICD-10-CM | POA: Diagnosis present

## 2023-06-19 DIAGNOSIS — Z79899 Other long term (current) drug therapy: Secondary | ICD-10-CM | POA: Diagnosis not present

## 2023-06-19 DIAGNOSIS — K5792 Diverticulitis of intestine, part unspecified, without perforation or abscess without bleeding: Secondary | ICD-10-CM | POA: Insufficient documentation

## 2023-06-19 LAB — URINALYSIS, ROUTINE W REFLEX MICROSCOPIC
Bilirubin Urine: NEGATIVE
Glucose, UA: NEGATIVE mg/dL
Ketones, ur: NEGATIVE mg/dL
Leukocytes,Ua: NEGATIVE
Nitrite: NEGATIVE
Protein, ur: 100 mg/dL — AB
Specific Gravity, Urine: 1.02 (ref 1.005–1.030)
pH: 6.5 (ref 5.0–8.0)

## 2023-06-19 LAB — COMPREHENSIVE METABOLIC PANEL
ALT: 27 U/L (ref 0–44)
AST: 27 U/L (ref 15–41)
Albumin: 4 g/dL (ref 3.5–5.0)
Alkaline Phosphatase: 63 U/L (ref 38–126)
Anion gap: 13 (ref 5–15)
BUN: 20 mg/dL (ref 6–20)
CO2: 25 mmol/L (ref 22–32)
Calcium: 8.6 mg/dL — ABNORMAL LOW (ref 8.9–10.3)
Chloride: 99 mmol/L (ref 98–111)
Creatinine, Ser: 1.08 mg/dL (ref 0.61–1.24)
GFR, Estimated: >60 mL/min (ref >60)
Glucose, Bld: 123 mg/dL — ABNORMAL HIGH (ref 70–99)
Potassium: 2.8 mmol/L — ABNORMAL LOW (ref 3.5–5.1)
Sodium: 137 mmol/L (ref 135–145)
Total Bilirubin: 0.7 mg/dL (ref 0.3–1.2)
Total Protein: 8 g/dL (ref 6.5–8.1)

## 2023-06-19 LAB — CBC
HCT: 40.7 % (ref 39.0–52.0)
Hemoglobin: 14 g/dL (ref 13.0–17.0)
MCH: 33.3 pg (ref 26.0–34.0)
MCHC: 34.4 g/dL (ref 30.0–36.0)
MCV: 96.7 fL (ref 80.0–100.0)
Platelets: 150 10*3/uL (ref 150–400)
RBC: 4.21 MIL/uL — ABNORMAL LOW (ref 4.22–5.81)
RDW: 14.5 % (ref 11.5–15.5)
WBC: 7.9 10*3/uL (ref 4.0–10.5)
nRBC: 0 % (ref 0.0–0.2)

## 2023-06-19 LAB — URINALYSIS, MICROSCOPIC (REFLEX): WBC, UA: NONE SEEN WBC/hpf (ref 0–5)

## 2023-06-19 LAB — MAGNESIUM: Magnesium: 1.9 mg/dL (ref 1.7–2.4)

## 2023-06-19 LAB — LIPASE, BLOOD: Lipase: 44 U/L (ref 11–51)

## 2023-06-19 MED ORDER — IOHEXOL 300 MG/ML  SOLN
100.0000 mL | Freq: Once | INTRAMUSCULAR | Status: AC | PRN
  Administered 2023-06-19: 100 mL via INTRAVENOUS

## 2023-06-19 MED ORDER — AMOXICILLIN-POT CLAVULANATE 875-125 MG PO TABS: 1.0000 | ORAL_TABLET | Freq: Two times a day (BID) | ORAL | 0 refills | Status: AC

## 2023-06-19 MED ORDER — POTASSIUM CHLORIDE 10 MEQ/100ML IV SOLN
10.0000 meq | INTRAVENOUS | Status: AC
  Administered 2023-06-19 (×2): 10 meq via INTRAVENOUS
  Filled 2023-06-19 (×2): qty 100

## 2023-06-19 MED ORDER — POTASSIUM CHLORIDE ER 20 MEQ PO TBCR: 20.0000 meq | EXTENDED_RELEASE_TABLET | Freq: Two times a day (BID) | ORAL | 0 refills | Status: AC

## 2023-06-19 NOTE — ED Triage Notes (Signed)
LLQ pain x 2 days , denies NV D.  Constipation x 2 days , Hx hard stools.  No dysuria .

## 2023-06-19 NOTE — ED Provider Notes (Signed)
[place] [facility] Provider Note   CSN: [id number] Arrival date & time: 06/19/23  5366     History  Chief Complaint  Patient presents with   Abdominal Pain    LLQ    Kurt Wilkinson is a 57 y.o. male. Education administrator used.  Abdominal Pain Patient with lower abdominal pain.  Has had for last couple days.  Some constipation.  States pain when he goes.  Pain is in the lower abdomen.  No dysuria.  Has had pain like this before.  No fevers.  No nausea or vomiting.     Home Medications Prior to Admission medications   Medication Sig Start Date End Date Taking? Authorizing Provider  amoxicillin-clavulanate (AUGMENTIN) 875-125 MG tablet Take 1 tablet by mouth every 12 (twelve) hours. 06/19/23  Yes Benjiman Core, MD  amLODipine (NORVASC) 10 MG tablet Take 1 tablet (10 mg total) by mouth daily. 02/18/20   Linwood Dibbles, MD  diphenhydrAMINE (BENADRYL) 25 MG tablet Take 2 tablets (50 mg total) by mouth every 6 (six) hours as needed for itching or allergies. 02/18/20   Molpus, John, MD  docusate sodium (COLACE) 100 MG capsule Take 1 capsule (100 mg total) by mouth every 12 (twelve) hours. 09/16/22   Renne Crigler, PA-C  EPINEPHrine (EPIPEN 2-PAK) 0.3 mg/0.3 mL IJ SOAJ injection Self administer per package instructions as needed for severe allergic reaction and seek medical care. 02/18/20   Molpus, John, MD  hydrocortisone (ANUSOL-HC) 25 MG suppository Place 1 suppository (25 mg total) rectally 2 (two) times daily. 09/16/22   Renne Crigler, PA-C  lidocaine (XYLOCAINE) 2 % jelly Apply 1 Application topically as needed. 09/16/22   Renne Crigler, PA-C  lisinopril (ZESTRIL) 40 MG tablet Take 40 mg by mouth in the morning and at bedtime.    [provider]  oxymetazoline (AFRIN) 0.05 % nasal spray Place 1 spray into both nostrils 2 (two) times daily.    [provider]  Potassium Chloride ER 20 MEQ TBCR Take 1 tablet (20 mEq total) by  mouth in the morning and at bedtime for 5 days. 06/19/23 06/24/23  Benjiman Core, MD      Allergies    Parke Simmers allergy]    Review of Systems   Review of Systems  Gastrointestinal:  Positive for abdominal pain.    Physical Exam Updated Vital Signs BP (!) 147/93   Pulse (!) 118   Temp 98.8 F (37.1 C) (Oral)   Resp 14   Wt 90.7 kg   SpO2 98%   BMI 29.53 kg/m  Physical Exam Vitals and nursing note reviewed.  HENT:     Head: Atraumatic.  Cardiovascular:     Rate and Rhythm: Normal rate.  Pulmonary:     Breath sounds: Normal breath sounds.  Abdominal:     Tenderness: There is abdominal tenderness.     Hernia: No hernia is present.     Comments: Left lower quadrant tenderness rebound or guarding.  No hernia palpated.  Neurological:     Mental Status: He is alert.     ED Results / Procedures / Treatments   Labs (all labs ordered are listed, but only abnormal results are displayed) Labs Reviewed  COMPREHENSIVE METABOLIC PANEL - Abnormal; Notable for the following components:      Result Value   Potassium 2.8 (*)    Glucose, Bld 123 (*)    Calcium 8.6 (*)    All other components within normal limits  CBC -  Abnormal; Notable for the following components:   RBC 4.21 (*)    All other components within normal limits  URINALYSIS, ROUTINE W REFLEX MICROSCOPIC - Abnormal; Notable for the following components:   Hgb urine dipstick MODERATE (*)    Protein, ur 100 (*)    All other components within normal limits  URINALYSIS, MICROSCOPIC (REFLEX) - Abnormal; Notable for the following components:   Bacteria, UA RARE (*)    All other components within normal limits  LIPASE, BLOOD  MAGNESIUM    EKG None  Radiology CT ABDOMEN PELVIS W CONTRAST  Result Date: 06/19/2023 CLINICAL DATA:  Left lower quadrant pain. EXAM: CT ABDOMEN AND PELVIS WITH CONTRAST TECHNIQUE: Multidetector CT imaging of the abdomen and pelvis was performed using the standard protocol following  bolus administration of intravenous contrast. RADIATION DOSE REDUCTION: This exam was performed according to the departmental dose-optimization program which includes automated exposure control, adjustment of the mA and/or kV according to patient size and/or use of iterative reconstruction technique. CONTRAST:  OMNIPAQUE IOHEXOL 300 MG/ML  SOLN COMPARISON:  None Available. FINDINGS: Lower chest: No acute abnormality. Hepatobiliary: There is diffuse fatty infiltration of the liver parenchyma. No focal liver abnormality is seen. No gallstones, gallbladder wall thickening, or biliary dilatation. Pancreas: Unremarkable. No pancreatic ductal dilatation or surrounding inflammatory changes. Spleen: Normal in size without focal abnormality. Adrenals/Urinary Tract: Adrenal glands are unremarkable. Kidneys are normal in size, without renal calculi or hydronephrosis. Multiple bilateral renal cysts are seen. The largest measures 2.2 cm in diameter and is seen along the posterolateral aspect of the mid right kidney. Bladder is unremarkable. Stomach/Bowel: Stomach is within normal limits. Appendix appears normal. No evidence of bowel dilatation. A moderately inflamed diverticulum is seen within the distal descending colon. Vascular/Lymphatic: Aortic atherosclerosis. No enlarged abdominal or pelvic lymph nodes. Reproductive: There is moderate to marked severity prostate gland enlargement. Other: No abdominal wall hernia or abnormality. No abdominopelvic ascites. Musculoskeletal: No acute or significant osseous findings. IMPRESSION: 1. Moderate severity distal descending colonic diverticulitis. 2. Hepatic steatosis. 3. Multiple bilateral renal cysts. Follow-up with nonemergent renal ultrasound is recommended. This recommendation follows ACR consensus guidelines: Management of the Incidental Renal Mass on CT: A White Paper of the ACR Incidental Findings Committee. J Am Coll Radiol [phone]. 4. Moderate to marked  severity prostate gland enlargement. 5. Aortic atherosclerosis. Aortic Atherosclerosis (ICD10-I70.0). Electronically Signed   By: Aram Candela M.D.   On: 06/19/2023 22:10    Procedures Procedures    Medications Ordered in ED Medications  potassium chloride 10 mEq in 100 mL IVPB (10 mEq Intravenous New Bag/Given 06/19/23 2303)  iohexol (OMNIPAQUE) 300 MG/ML solution 100 mL (100 mLs Intravenous Contrast Given 06/19/23 2137)    ED Course/ Medical Decision Making/ A&P                                 Medical Decision Making Amount and/or Complexity of Data Reviewed Labs: ordered. Radiology: ordered.  Risk Prescription drug management.   Patient with left lower quad abdominal pain.  Differential diagnosis includes constipation, diverticulitis.  Less likely cause such as kidney stone, though there is some mild hematuria.  With chronic tenderness will get CT scan to evaluate.  Blood work shows a somewhat chronic hypokalemia.  Will check magnesium also.  Magnesium reassuring.  CT scan shows uncomplicated diverticulitis.  Will give antibiotics and potassium for home.  Has tolerated orals.  Feels that he can  manage the pain and have good oral intake.  Will discharge.        Final Clinical Impression(s) / ED Diagnoses Final diagnoses:  Diverticulitis  Hypokalemia    Rx / DC Orders ED Discharge Orders          Ordered    amoxicillin-clavulanate (AUGMENTIN) 875-125 MG tablet  Every 12 hours        06/19/23 2341    Potassium Chloride ER 20 MEQ TBCR  2 times daily        06/19/23 2341              Benjiman Core, MD 06/19/23 2341

## 2023-06-19 NOTE — Discharge Instructions (Addendum)
Follow up with your doctor

## 2024-03-25 ENCOUNTER — Other Ambulatory Visit: Payer: Self-pay

## 2024-03-25 ENCOUNTER — Encounter (HOSPITAL_BASED_OUTPATIENT_CLINIC_OR_DEPARTMENT_OTHER): Payer: Self-pay | Admitting: Urology

## 2024-03-25 ENCOUNTER — Emergency Department (HOSPITAL_BASED_OUTPATIENT_CLINIC_OR_DEPARTMENT_OTHER)
Admission: EM | Admit: 2024-03-25 | Discharge: 2024-03-25 | Disposition: A | Discharge status: Home / Self Care | Attending: Emergency Medicine | Admitting: Emergency Medicine

## 2024-03-25 DIAGNOSIS — T1502XA Foreign body in cornea, left eye, initial encounter: Secondary | ICD-10-CM | POA: Insufficient documentation

## 2024-03-25 DIAGNOSIS — T1592XA Foreign body on external eye, part unspecified, left eye, initial encounter: Secondary | ICD-10-CM

## 2024-03-25 DIAGNOSIS — W448XXA Other foreign body entering into or through a natural orifice, initial encounter: Secondary | ICD-10-CM | POA: Insufficient documentation

## 2024-03-25 DIAGNOSIS — H16002 Unspecified corneal ulcer, left eye: Secondary | ICD-10-CM | POA: Diagnosis not present

## 2024-03-25 DIAGNOSIS — H5789 Other specified disorders of eye and adnexa: Secondary | ICD-10-CM | POA: Diagnosis present

## 2024-03-25 MED ORDER — TETRACAINE HCL 0.5 % OP SOLN
1.0000 [drp] | Freq: Once | OPHTHALMIC | Status: AC
  Administered 2024-03-25: 1 [drp] via OPHTHALMIC
  Filled 2024-03-25: qty 4

## 2024-03-25 MED ORDER — MOXIFLOXACIN HCL 0.5 % OP SOLN: 1.0000 [drp] | OPHTHALMIC | 0 refills | Status: DC

## 2024-03-25 MED ORDER — MOXIFLOXACIN HCL 0.5 % OP SOLN
1.0000 [drp] | Freq: Three times a day (TID) | OPHTHALMIC | Status: DC
Start: 2024-03-25 — End: 2024-03-25

## 2024-03-25 MED ORDER — MOXIFLOXACIN HCL 0.5 % OP SOLN: 1.0000 [drp] | OPHTHALMIC | 0 refills | Status: AC

## 2024-03-25 MED ORDER — MOXIFLOXACIN HCL 0.5 % OP SOLN: 1.0000 [drp] | Freq: Three times a day (TID) | OPHTHALMIC | 0 refills | Status: DC

## 2024-03-25 MED ORDER — FLUORESCEIN SODIUM 1 MG OP STRP
1.0000 | ORAL_STRIP | Freq: Once | OPHTHALMIC | Status: AC
  Administered 2024-03-25: 1 via OPHTHALMIC
  Filled 2024-03-25: qty 1

## 2024-03-25 NOTE — Discharge Instructions (Addendum)
  Por favor, acuda maana a la consulta de oftalmologa para una nueva evaluacin en la clnica. Le administraremos gotas antibiticas, ya que el cuerpo extrao que le extrajeron se debe a una lcera residual en el ojo.

## 2024-03-25 NOTE — ED Provider Notes (Signed)
 [place] [facility] Provider Note   CSN: [id number] Arrival date & time: 03/25/24  1905     History  Chief Complaint  Patient presents with   Eye Problem    Kurt Wilkinson is a 58 y.o. male.   Eye Problem    58 year old male presenting to the emergency department with complaint of eye foreign body.  The history was provided with the aid of a Spanish language video interpreter.  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  He tried flushing his eye without improvement.  He has had persistent sensation of foreign body in his left eye.  He endorses blurry vision.  Home Medications Prior to Admission medications   Medication Sig Start Date End Date Taking? Authorizing Provider  amLODipine  (NORVASC ) 10 MG tablet Take 1 tablet (10 mg total) by mouth daily. 02/18/20   Trish Furl, MD  amoxicillin -clavulanate (AUGMENTIN ) 875-125 MG tablet Take 1 tablet by mouth every 12 (twelve) hours. 06/19/23   Mozell Arias, MD  diphenhydrAMINE  (BENADRYL ) 25 MG tablet Take 2 tablets (50 mg total) by mouth every 6 (six) hours as needed for itching or allergies. 02/18/20   Molpus, John, MD  docusate sodium  (COLACE) 100 MG capsule Take 1 capsule (100 mg total) by mouth every 12 (twelve) hours. 09/16/22   Geiple, Joshua, PA-C  EPINEPHrine  (EPIPEN  2-PAK) 0.3 mg/0.3 mL IJ SOAJ injection Self administer per package instructions as needed for severe allergic reaction and seek medical care. 02/18/20   Molpus, John, MD  hydrocortisone  (ANUSOL -HC) 25 MG suppository Place 1 suppository (25 mg total) rectally 2 (two) times daily. 09/16/22   Geiple, Joshua, PA-C  lidocaine  (XYLOCAINE ) 2 % jelly Apply 1 Application topically as needed. 09/16/22   Geiple, Joshua, PA-C  lisinopril (ZESTRIL) 40 MG tablet Take 40 mg by mouth in the morning and at bedtime.    [provider]   moxifloxacin (VIGAMOX) 0.5 % ophthalmic solution Place 1 drop into the left eye every hour while awake for 2 days. 03/25/24 03/27/24  Rosealee Concha, MD  oxymetazoline (AFRIN) 0.05 % nasal spray Place 1 spray into both nostrils 2 (two) times daily.    [provider]  Potassium Chloride  ER 20 MEQ TBCR Take 1 tablet (20 mEq total) by mouth in the morning and at bedtime for 5 days. 06/19/23 06/24/23  Mozell Arias, MD      Allergies    Stefan Edge allergy]    Review of Systems   Review of Systems  All other systems reviewed and are negative.   Physical Exam Updated Vital Signs BP (!) 158/96 (BP Location: Left Arm)   Pulse 93   Temp 97.7 F (36.5 C)   Resp 16   Ht 5\' 9"  (1.753 m)   Wt 90.7 kg   SpO2 100%   BMI 29.53 kg/m  Physical Exam Vitals and nursing note reviewed.  Constitutional:      General: He is not in acute distress. HENT:     Head: Normocephalic and atraumatic.  Eyes:     Conjunctiva/sclera: Conjunctivae normal.     Pupils: Pupils are equal, round, and reactive to light.     Comments: Left eye with evidence of metallic foreign body in the 9 o'clock position on left cornea, subsequently removed, positive fluorescein uptake consistent with mild ulceration. Visual Acuity Bilateral Distance: 20/20 R Distance: 20/32 L Distance: 20/25  Cardiovascular:     Rate and Rhythm: Normal rate and regular rhythm.  Pulmonary:     Effort: Pulmonary effort is normal. No respiratory distress.  Abdominal:     General: There is no distension.     Tenderness: There is no guarding.  Musculoskeletal:        General: No deformity or signs of injury.     Cervical back: Neck supple.  Skin:    Findings: No lesion or rash.  Neurological:     General: No focal deficit present.     Mental Status: He is alert. Mental status is at baseline.       ED Results / Procedures / Treatments   Labs (all labs ordered are listed, but only abnormal results are displayed) Labs  Reviewed - No data to display  EKG None  Radiology No results found.  Procedures .Foreign Body Removal  Date/Time: 03/25/2024 10:51 PM  Performed by: Rosealee Concha, MD Authorized by: Rosealee Concha, MD  Consent: Verbal consent obtained. Consent given by: patient Patient identity confirmed: verbally with patient Body area: eye Location details: left cornea Anesthesia method: Tetracaine. Patient cooperative: yes Localization method: visualized Removal mechanism: moist cotton swab Eye examined with fluorescein. Fluorescein uptake. Corneal abrasion size: small Corneal abrasion location: medial No residual rust ring present.      Medications Ordered in ED Medications  fluorescein ophthalmic strip 1 strip (1 strip Both Eyes Given 03/25/24 2219)  tetracaine (PONTOCAINE) 0.5 % ophthalmic solution 1 drop (1 drop Both Eyes Given 03/25/24 2219)    ED Course/ Medical Decision Making/ A&P                                 Medical Decision Making Risk Prescription drug management.    58 year old male presenting to the emergency department with complaint of eye foreign body.  The history was provided with the aid of a Spanish language video interpreter.  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  He tried flushing his eye without improvement.  He has had persistent sensation of foreign body in his left eye.  He endorses blurry vision.  On arrival, the patient was vitally stable.  Physical exam revealed evidence of a metallic foreign body along the cornea at the 9 o'clock position along the medial aspect of the cornea.  Tetracaine was administered in addition to fluorescein staining.  The foreign body was removed as per the procedure note above.  Residual fluorescein uptake was noted as per imaging documented above concerning for mild/small corneal ulceration.  Discussed with on-call  ophthalmology, Dr. Josefa Ni who recommended follow-up in clinic tomorrow for repeat assessment, initiation of Vigamox antibiotic drops. Visual acuity intact.  No Vigamox on formulary here, prescription sent to the 24-hour pharmacy [facility] in [place].  Patient advised via Spanish language interpreter to follow-up closely with ophthalmology tomorrow and administer the drops every hour while awake.   Final Clinical Impression(s) / ED Diagnoses Final diagnoses:  Foreign body of left eye, initial encounter  Corneal ulcer of left eye    Rx / DC Orders ED Discharge Orders          Ordered    moxifloxacin (VIGAMOX) 0.5 % ophthalmic solution  3 times daily,   Status:  Discontinued        03/25/24 2257    moxifloxacin (  VIGAMOX) 0.5 % ophthalmic solution  Every hour while awake,   Status:  Discontinued        03/25/24 2309    moxifloxacin (VIGAMOX) 0.5 % ophthalmic solution  Every hour while awake        03/25/24 2310              Rosealee Concha, MD 03/25/24 2312

## 2024-03-25 NOTE — ED Triage Notes (Signed)
 Spanish interpreter needed  [phone]   Pt states  Was polishing car and metal piece went into left eye States it is red and used eye drops but it still hurts
